# Patient Record
Sex: Male | Born: 1945
Health system: Southern US, Community
[De-identification: ages and names within clinical notes are randomized; demographics above are authoritative.]

## PROBLEM LIST (undated history)

## (undated) DIAGNOSIS — K219 Gastro-esophageal reflux disease without esophagitis: Secondary | ICD-10-CM

## (undated) DIAGNOSIS — L570 Actinic keratosis: Secondary | ICD-10-CM

## (undated) DIAGNOSIS — M1990 Unspecified osteoarthritis, unspecified site: Secondary | ICD-10-CM

## (undated) DIAGNOSIS — I2510 Atherosclerotic heart disease of native coronary artery without angina pectoris: Secondary | ICD-10-CM

## (undated) DIAGNOSIS — K5792 Diverticulitis of intestine, part unspecified, without perforation or abscess without bleeding: Secondary | ICD-10-CM

## (undated) DIAGNOSIS — E7800 Pure hypercholesterolemia, unspecified: Secondary | ICD-10-CM

## (undated) DIAGNOSIS — I10 Essential (primary) hypertension: Secondary | ICD-10-CM

## (undated) DIAGNOSIS — C801 Malignant (primary) neoplasm, unspecified: Secondary | ICD-10-CM

## (undated) HISTORY — PX: OTHER SURGICAL HISTORY: SHX169

## (undated) HISTORY — DX: Actinic keratosis: L57.0

---

## 1978-07-25 HISTORY — PX: OTHER SURGICAL HISTORY: SHX169

## 1999-12-21 ENCOUNTER — Encounter: Payer: Self-pay | Admitting: *Deleted

## 1999-12-21 ENCOUNTER — Ambulatory Visit (HOSPITAL_COMMUNITY): Admission: RE | Admit: 1999-12-21 | Discharge: 1999-12-21 | Payer: Self-pay | Admitting: *Deleted

## 2000-06-13 ENCOUNTER — Ambulatory Visit (HOSPITAL_COMMUNITY): Admission: RE | Admit: 2000-06-13 | Discharge: 2000-06-13 | Payer: Self-pay | Admitting: *Deleted

## 2004-03-12 ENCOUNTER — Emergency Department (HOSPITAL_COMMUNITY): Admission: EM | Admit: 2004-03-12 | Discharge: 2004-03-12 | Payer: Self-pay | Admitting: Emergency Medicine

## 2004-03-13 ENCOUNTER — Observation Stay (HOSPITAL_COMMUNITY): Admission: EM | Admit: 2004-03-13 | Discharge: 2004-03-13 | Payer: Self-pay | Admitting: Emergency Medicine

## 2008-04-01 ENCOUNTER — Encounter: Admission: RE | Admit: 2008-04-01 | Discharge: 2008-04-01 | Payer: Self-pay | Admitting: Internal Medicine

## 2008-11-03 DIAGNOSIS — C4491 Basal cell carcinoma of skin, unspecified: Secondary | ICD-10-CM

## 2008-11-03 DIAGNOSIS — C4492 Squamous cell carcinoma of skin, unspecified: Secondary | ICD-10-CM

## 2008-11-03 HISTORY — DX: Basal cell carcinoma of skin, unspecified: C44.91

## 2008-11-03 HISTORY — DX: Squamous cell carcinoma of skin, unspecified: C44.92

## 2009-02-10 DIAGNOSIS — C4499 Other specified malignant neoplasm of skin, unspecified: Secondary | ICD-10-CM

## 2009-02-10 HISTORY — DX: Other specified malignant neoplasm of skin, unspecified: C44.99

## 2009-09-28 DIAGNOSIS — C44320 Squamous cell carcinoma of skin of unspecified parts of face: Secondary | ICD-10-CM

## 2009-09-28 HISTORY — DX: Squamous cell carcinoma of skin of unspecified parts of face: C44.320

## 2010-01-18 DIAGNOSIS — C44519 Basal cell carcinoma of skin of other part of trunk: Secondary | ICD-10-CM

## 2010-01-18 HISTORY — DX: Basal cell carcinoma of skin of other part of trunk: C44.519

## 2010-12-10 NOTE — Cardiovascular Report (Signed)
[place]. [facility]  Patient:    Paul Mann, Paul Mann                        MRN: [id number] Proc. Date: 06/13/00 Adm. Date:  [id number] Attending:  Meade Maw A CC:         Cardiac Catheterization Laboratory  Georgann Housekeeper, M.D.   Cardiac Catheterization  PROCEDURE:  Cardiac catheterization.  CARDIOLOGIST:  Meade Maw, M.D.  INDICATIONS:  Chest pain, fatigue, with a small recent redistribution on the anterior wall.   Paul Mann is a 65 year old gentleman who has recently initiated an exercise training program.  During his exercise he experienced left shoulder pain, which radiated to his left lower back, and appeared to be aggravated by exertion.  A stress Cardiolyte was subsequently performed, revealing a small area of redistribution in the anterior apical region.  The ejection fraction was 58%.  The findings were discussed with the patient.  The patient was noted to have good exercise tolerance.  He exercises for nine minutes and 32 seconds.  He was noted to have 1.5 mm upsloping ST depression with exercise.  He had no inducible chest pain with exercise.  The risks, benefits, and options were subsequently discussed with the patient, and the patient elected to to proceed with a left heart catheterization.  DESCRIPTION OF PROCEDURE:  After obtaining a written informed consent, the patient was brought to the cardiac catheterization laboratory in the postabsorptive state.  Preoperative sedation was achieved using IV Versed. The right groin was prepped and draped in the usual sterile fashion.  Local anesthesia was achieved using 1% Xylocaine.  A 6-French hemostasis sheath was placed into the right femoral artery using the modified Seldinger technique. Selective coronary angiography was performed using JL4 and JR4 Judkins catheters.  Multiple views were obtained.  All catheter exchanges were made over a guide wire.  The hemostasis sheath was flushed following  each injection.  Following the procedure, the films were reviewed with Dr. Darci Needle III.  There was no critical coronary artery disease.  The patient was transferred to the holding area, and the hemostasis sheath was removed.  Hemostasis was achieved using digital pressure.  FINDINGS: Aortic pressure:  112/61. LV pressure:  112/11.  VENTRICULOGRAM:  A single plane ventriculogram revealed normal wall motion, ejection fraction of approximately 65%.  CORONARY ANGIOGRAPHY: 1. Left main coronary artery:  The left main coronary artery bifurcated    into the left anterior descending coronary artery and the circumflex    vessel.  There was no significant disease in the left main coronary    artery. 2. Left anterior descending coronary artery:  The LAD had mild diffuse    disease throughout the artery, and gave rise to a small to moderate    sized D-I and D-II, and then a small D-III and D-IV before ending as an    apical recurrent branch.  There was a 40% midvessel lesion noted in    the LAD. 3. Circumflex coronary artery:  The circumflex vessel gave rise to a moderate    sized OM-I and an eccentric 40%-50% proximal lesion, and a more distal    40% lesion. 4. Right coronary artery:  The right coronary artery was dominant, and    had a midvessel 30%-40% lesion, and a small PDA.  FINAL IMPRESSION:  Noncritical disease involving the mid-left anterior descending coronary artery, proximal circumflex, midcircumflex, and mid-right coronary artery with preserved left ventricular  function.  RECOMMENDATIONS:  Medical management is recommended.  I do not feel that  the nature of this disease is accountable for his chest pain.  He may continue with his exercise program.  Secondary risk reduction should be considered. His LDL should be less than 100.  The patient should continue with aspirin. DD:  06/13/00 TD:  06/13/00 Job: [id number]

## 2011-04-26 ENCOUNTER — Other Ambulatory Visit: Payer: Self-pay | Admitting: Sports Medicine

## 2011-04-26 DIAGNOSIS — M545 Low back pain: Secondary | ICD-10-CM

## 2011-04-29 ENCOUNTER — Ambulatory Visit
Admission: RE | Admit: 2011-04-29 | Discharge: 2011-04-29 | Disposition: A | Discharge status: Home / Self Care | Payer: Medicare Other | Source: Ambulatory Visit | Attending: Sports Medicine | Admitting: Sports Medicine

## 2011-04-29 DIAGNOSIS — M545 Low back pain, unspecified: Secondary | ICD-10-CM

## 2011-05-01 ENCOUNTER — Other Ambulatory Visit: Payer: Self-pay

## 2011-08-01 DIAGNOSIS — Z85828 Personal history of other malignant neoplasm of skin: Secondary | ICD-10-CM | POA: Diagnosis not present

## 2011-08-01 DIAGNOSIS — C4441 Basal cell carcinoma of skin of scalp and neck: Secondary | ICD-10-CM | POA: Diagnosis not present

## 2011-08-01 DIAGNOSIS — B079 Viral wart, unspecified: Secondary | ICD-10-CM | POA: Diagnosis not present

## 2011-08-01 DIAGNOSIS — L570 Actinic keratosis: Secondary | ICD-10-CM | POA: Diagnosis not present

## 2011-08-29 DIAGNOSIS — N529 Male erectile dysfunction, unspecified: Secondary | ICD-10-CM | POA: Diagnosis not present

## 2011-08-29 DIAGNOSIS — N400 Enlarged prostate without lower urinary tract symptoms: Secondary | ICD-10-CM | POA: Diagnosis not present

## 2011-08-29 DIAGNOSIS — N401 Enlarged prostate with lower urinary tract symptoms: Secondary | ICD-10-CM | POA: Diagnosis not present

## 2011-10-25 DIAGNOSIS — I10 Essential (primary) hypertension: Secondary | ICD-10-CM | POA: Diagnosis not present

## 2011-10-25 DIAGNOSIS — J309 Allergic rhinitis, unspecified: Secondary | ICD-10-CM | POA: Diagnosis not present

## 2011-10-25 DIAGNOSIS — Z1331 Encounter for screening for depression: Secondary | ICD-10-CM | POA: Diagnosis not present

## 2011-10-25 DIAGNOSIS — E782 Mixed hyperlipidemia: Secondary | ICD-10-CM | POA: Diagnosis not present

## 2011-10-25 DIAGNOSIS — Z0000 Encounter for general adult medical examination without abnormal findings: Secondary | ICD-10-CM | POA: Diagnosis not present

## 2011-12-07 DIAGNOSIS — E782 Mixed hyperlipidemia: Secondary | ICD-10-CM | POA: Diagnosis not present

## 2012-01-09 DIAGNOSIS — N400 Enlarged prostate without lower urinary tract symptoms: Secondary | ICD-10-CM | POA: Diagnosis not present

## 2012-01-09 DIAGNOSIS — E291 Testicular hypofunction: Secondary | ICD-10-CM | POA: Diagnosis not present

## 2012-01-11 DIAGNOSIS — N401 Enlarged prostate with lower urinary tract symptoms: Secondary | ICD-10-CM | POA: Diagnosis not present

## 2012-01-11 DIAGNOSIS — E291 Testicular hypofunction: Secondary | ICD-10-CM | POA: Diagnosis not present

## 2012-01-11 DIAGNOSIS — N529 Male erectile dysfunction, unspecified: Secondary | ICD-10-CM | POA: Diagnosis not present

## 2012-01-11 DIAGNOSIS — N400 Enlarged prostate without lower urinary tract symptoms: Secondary | ICD-10-CM | POA: Diagnosis not present

## 2012-01-13 DIAGNOSIS — E291 Testicular hypofunction: Secondary | ICD-10-CM | POA: Diagnosis not present

## 2012-01-13 DIAGNOSIS — E782 Mixed hyperlipidemia: Secondary | ICD-10-CM | POA: Diagnosis not present

## 2012-01-20 DIAGNOSIS — E291 Testicular hypofunction: Secondary | ICD-10-CM | POA: Diagnosis not present

## 2012-01-27 DIAGNOSIS — E291 Testicular hypofunction: Secondary | ICD-10-CM | POA: Diagnosis not present

## 2012-02-02 DIAGNOSIS — D2330 Other benign neoplasm of skin of unspecified part of face: Secondary | ICD-10-CM | POA: Diagnosis not present

## 2012-02-02 DIAGNOSIS — C44319 Basal cell carcinoma of skin of other parts of face: Secondary | ICD-10-CM | POA: Diagnosis not present

## 2012-02-10 DIAGNOSIS — E291 Testicular hypofunction: Secondary | ICD-10-CM | POA: Diagnosis not present

## 2012-02-24 DIAGNOSIS — E291 Testicular hypofunction: Secondary | ICD-10-CM | POA: Diagnosis not present

## 2012-03-09 DIAGNOSIS — E291 Testicular hypofunction: Secondary | ICD-10-CM | POA: Diagnosis not present

## 2012-03-23 DIAGNOSIS — E291 Testicular hypofunction: Secondary | ICD-10-CM | POA: Diagnosis not present

## 2012-04-06 DIAGNOSIS — E291 Testicular hypofunction: Secondary | ICD-10-CM | POA: Diagnosis not present

## 2012-04-19 DIAGNOSIS — E291 Testicular hypofunction: Secondary | ICD-10-CM | POA: Diagnosis not present

## 2012-04-30 DIAGNOSIS — M519 Unspecified thoracic, thoracolumbar and lumbosacral intervertebral disc disorder: Secondary | ICD-10-CM | POA: Diagnosis not present

## 2012-04-30 DIAGNOSIS — J309 Allergic rhinitis, unspecified: Secondary | ICD-10-CM | POA: Diagnosis not present

## 2012-04-30 DIAGNOSIS — N182 Chronic kidney disease, stage 2 (mild): Secondary | ICD-10-CM | POA: Diagnosis not present

## 2012-04-30 DIAGNOSIS — K219 Gastro-esophageal reflux disease without esophagitis: Secondary | ICD-10-CM | POA: Diagnosis not present

## 2012-04-30 DIAGNOSIS — E782 Mixed hyperlipidemia: Secondary | ICD-10-CM | POA: Diagnosis not present

## 2012-04-30 DIAGNOSIS — I10 Essential (primary) hypertension: Secondary | ICD-10-CM | POA: Diagnosis not present

## 2012-04-30 DIAGNOSIS — Z23 Encounter for immunization: Secondary | ICD-10-CM | POA: Diagnosis not present

## 2012-05-04 DIAGNOSIS — E291 Testicular hypofunction: Secondary | ICD-10-CM | POA: Diagnosis not present

## 2012-05-08 DIAGNOSIS — L578 Other skin changes due to chronic exposure to nonionizing radiation: Secondary | ICD-10-CM | POA: Diagnosis not present

## 2012-05-08 DIAGNOSIS — L570 Actinic keratosis: Secondary | ICD-10-CM | POA: Diagnosis not present

## 2012-05-08 DIAGNOSIS — Z85828 Personal history of other malignant neoplasm of skin: Secondary | ICD-10-CM | POA: Diagnosis not present

## 2012-05-08 DIAGNOSIS — L820 Inflamed seborrheic keratosis: Secondary | ICD-10-CM | POA: Diagnosis not present

## 2012-05-08 DIAGNOSIS — L821 Other seborrheic keratosis: Secondary | ICD-10-CM | POA: Diagnosis not present

## 2012-05-18 DIAGNOSIS — E291 Testicular hypofunction: Secondary | ICD-10-CM | POA: Diagnosis not present

## 2012-06-04 DIAGNOSIS — E291 Testicular hypofunction: Secondary | ICD-10-CM | POA: Diagnosis not present

## 2012-06-05 DIAGNOSIS — L570 Actinic keratosis: Secondary | ICD-10-CM | POA: Diagnosis not present

## 2012-06-18 DIAGNOSIS — E291 Testicular hypofunction: Secondary | ICD-10-CM | POA: Diagnosis not present

## 2012-06-25 DIAGNOSIS — E291 Testicular hypofunction: Secondary | ICD-10-CM | POA: Diagnosis not present

## 2012-06-25 DIAGNOSIS — N400 Enlarged prostate without lower urinary tract symptoms: Secondary | ICD-10-CM | POA: Diagnosis not present

## 2012-06-25 DIAGNOSIS — N529 Male erectile dysfunction, unspecified: Secondary | ICD-10-CM | POA: Diagnosis not present

## 2012-07-03 DIAGNOSIS — E291 Testicular hypofunction: Secondary | ICD-10-CM | POA: Diagnosis not present

## 2012-07-10 DIAGNOSIS — E291 Testicular hypofunction: Secondary | ICD-10-CM | POA: Diagnosis not present

## 2012-07-17 DIAGNOSIS — E291 Testicular hypofunction: Secondary | ICD-10-CM | POA: Diagnosis not present

## 2012-07-23 DIAGNOSIS — R0989 Other specified symptoms and signs involving the circulatory and respiratory systems: Secondary | ICD-10-CM | POA: Diagnosis not present

## 2012-07-23 DIAGNOSIS — R252 Cramp and spasm: Secondary | ICD-10-CM | POA: Diagnosis not present

## 2012-08-15 DIAGNOSIS — E291 Testicular hypofunction: Secondary | ICD-10-CM | POA: Diagnosis not present

## 2012-08-29 DIAGNOSIS — E291 Testicular hypofunction: Secondary | ICD-10-CM | POA: Diagnosis not present

## 2012-10-09 DIAGNOSIS — E291 Testicular hypofunction: Secondary | ICD-10-CM | POA: Diagnosis not present

## 2012-10-18 DIAGNOSIS — F4323 Adjustment disorder with mixed anxiety and depressed mood: Secondary | ICD-10-CM | POA: Diagnosis not present

## 2012-10-23 DIAGNOSIS — E291 Testicular hypofunction: Secondary | ICD-10-CM | POA: Diagnosis not present

## 2012-10-29 DIAGNOSIS — Z1331 Encounter for screening for depression: Secondary | ICD-10-CM | POA: Diagnosis not present

## 2012-10-29 DIAGNOSIS — E291 Testicular hypofunction: Secondary | ICD-10-CM | POA: Diagnosis not present

## 2012-10-29 DIAGNOSIS — K219 Gastro-esophageal reflux disease without esophagitis: Secondary | ICD-10-CM | POA: Diagnosis not present

## 2012-10-29 DIAGNOSIS — I10 Essential (primary) hypertension: Secondary | ICD-10-CM | POA: Diagnosis not present

## 2012-10-29 DIAGNOSIS — Z0000 Encounter for general adult medical examination without abnormal findings: Secondary | ICD-10-CM | POA: Diagnosis not present

## 2012-10-29 DIAGNOSIS — E782 Mixed hyperlipidemia: Secondary | ICD-10-CM | POA: Diagnosis not present

## 2012-10-29 DIAGNOSIS — N182 Chronic kidney disease, stage 2 (mild): Secondary | ICD-10-CM | POA: Diagnosis not present

## 2012-11-06 DIAGNOSIS — E291 Testicular hypofunction: Secondary | ICD-10-CM | POA: Diagnosis not present

## 2012-11-12 DIAGNOSIS — F4323 Adjustment disorder with mixed anxiety and depressed mood: Secondary | ICD-10-CM | POA: Diagnosis not present

## 2012-11-20 DIAGNOSIS — E291 Testicular hypofunction: Secondary | ICD-10-CM | POA: Diagnosis not present

## 2012-12-04 DIAGNOSIS — E291 Testicular hypofunction: Secondary | ICD-10-CM | POA: Diagnosis not present

## 2012-12-19 DIAGNOSIS — E291 Testicular hypofunction: Secondary | ICD-10-CM | POA: Diagnosis not present

## 2012-12-25 DIAGNOSIS — N529 Male erectile dysfunction, unspecified: Secondary | ICD-10-CM | POA: Diagnosis not present

## 2012-12-25 DIAGNOSIS — N400 Enlarged prostate without lower urinary tract symptoms: Secondary | ICD-10-CM | POA: Diagnosis not present

## 2012-12-25 DIAGNOSIS — E291 Testicular hypofunction: Secondary | ICD-10-CM | POA: Diagnosis not present

## 2012-12-28 DIAGNOSIS — F4323 Adjustment disorder with mixed anxiety and depressed mood: Secondary | ICD-10-CM | POA: Diagnosis not present

## 2013-01-01 DIAGNOSIS — E291 Testicular hypofunction: Secondary | ICD-10-CM | POA: Diagnosis not present

## 2013-01-15 DIAGNOSIS — E291 Testicular hypofunction: Secondary | ICD-10-CM | POA: Diagnosis not present

## 2013-01-29 DIAGNOSIS — E291 Testicular hypofunction: Secondary | ICD-10-CM | POA: Diagnosis not present

## 2013-02-18 DIAGNOSIS — E291 Testicular hypofunction: Secondary | ICD-10-CM | POA: Diagnosis not present

## 2013-03-04 DIAGNOSIS — E291 Testicular hypofunction: Secondary | ICD-10-CM | POA: Diagnosis not present

## 2013-03-18 DIAGNOSIS — E291 Testicular hypofunction: Secondary | ICD-10-CM | POA: Diagnosis not present

## 2013-04-02 DIAGNOSIS — E291 Testicular hypofunction: Secondary | ICD-10-CM | POA: Diagnosis not present

## 2013-04-17 DIAGNOSIS — E291 Testicular hypofunction: Secondary | ICD-10-CM | POA: Diagnosis not present

## 2013-04-29 DIAGNOSIS — N182 Chronic kidney disease, stage 2 (mild): Secondary | ICD-10-CM | POA: Diagnosis not present

## 2013-04-29 DIAGNOSIS — I10 Essential (primary) hypertension: Secondary | ICD-10-CM | POA: Diagnosis not present

## 2013-04-29 DIAGNOSIS — J309 Allergic rhinitis, unspecified: Secondary | ICD-10-CM | POA: Diagnosis not present

## 2013-04-29 DIAGNOSIS — K219 Gastro-esophageal reflux disease without esophagitis: Secondary | ICD-10-CM | POA: Diagnosis not present

## 2013-04-29 DIAGNOSIS — E782 Mixed hyperlipidemia: Secondary | ICD-10-CM | POA: Diagnosis not present

## 2013-04-30 DIAGNOSIS — E291 Testicular hypofunction: Secondary | ICD-10-CM | POA: Diagnosis not present

## 2013-05-15 DIAGNOSIS — E291 Testicular hypofunction: Secondary | ICD-10-CM | POA: Diagnosis not present

## 2013-05-23 DIAGNOSIS — Z23 Encounter for immunization: Secondary | ICD-10-CM | POA: Diagnosis not present

## 2013-05-29 DIAGNOSIS — E291 Testicular hypofunction: Secondary | ICD-10-CM | POA: Diagnosis not present

## 2013-06-12 DIAGNOSIS — E291 Testicular hypofunction: Secondary | ICD-10-CM | POA: Diagnosis not present

## 2013-06-25 DIAGNOSIS — E291 Testicular hypofunction: Secondary | ICD-10-CM | POA: Diagnosis not present

## 2013-07-01 DIAGNOSIS — E291 Testicular hypofunction: Secondary | ICD-10-CM | POA: Diagnosis not present

## 2013-07-08 DIAGNOSIS — N529 Male erectile dysfunction, unspecified: Secondary | ICD-10-CM | POA: Diagnosis not present

## 2013-07-08 DIAGNOSIS — N400 Enlarged prostate without lower urinary tract symptoms: Secondary | ICD-10-CM | POA: Diagnosis not present

## 2013-07-08 DIAGNOSIS — E291 Testicular hypofunction: Secondary | ICD-10-CM | POA: Diagnosis not present

## 2013-07-09 DIAGNOSIS — L570 Actinic keratosis: Secondary | ICD-10-CM | POA: Diagnosis not present

## 2013-07-09 DIAGNOSIS — L821 Other seborrheic keratosis: Secondary | ICD-10-CM | POA: Diagnosis not present

## 2013-07-09 DIAGNOSIS — C4441 Basal cell carcinoma of skin of scalp and neck: Secondary | ICD-10-CM | POA: Diagnosis not present

## 2013-07-09 DIAGNOSIS — D239 Other benign neoplasm of skin, unspecified: Secondary | ICD-10-CM | POA: Diagnosis not present

## 2013-07-09 DIAGNOSIS — L820 Inflamed seborrheic keratosis: Secondary | ICD-10-CM | POA: Diagnosis not present

## 2013-07-09 DIAGNOSIS — Z85828 Personal history of other malignant neoplasm of skin: Secondary | ICD-10-CM | POA: Diagnosis not present

## 2013-07-09 DIAGNOSIS — L578 Other skin changes due to chronic exposure to nonionizing radiation: Secondary | ICD-10-CM | POA: Diagnosis not present

## 2013-07-09 DIAGNOSIS — D485 Neoplasm of uncertain behavior of skin: Secondary | ICD-10-CM | POA: Diagnosis not present

## 2013-07-23 DIAGNOSIS — J111 Influenza due to unidentified influenza virus with other respiratory manifestations: Secondary | ICD-10-CM | POA: Diagnosis not present

## 2013-09-09 DIAGNOSIS — C4441 Basal cell carcinoma of skin of scalp and neck: Secondary | ICD-10-CM | POA: Diagnosis not present

## 2013-10-24 ENCOUNTER — Ambulatory Visit (INDEPENDENT_AMBULATORY_CARE_PROVIDER_SITE_OTHER): Payer: Medicare Other | Admitting: Sports Medicine

## 2013-10-24 ENCOUNTER — Encounter: Payer: Self-pay | Admitting: Sports Medicine

## 2013-10-24 VITALS — BP 162/83 | Ht 71.0 in | Wt 184.0 lb

## 2013-10-24 DIAGNOSIS — M542 Cervicalgia: Secondary | ICD-10-CM

## 2013-10-24 MED ORDER — KETOROLAC TROMETHAMINE 60 MG/2ML IM SOLN
60.0000 mg | Freq: Once | INTRAMUSCULAR | Status: AC
  Administered 2013-10-24: 60 mg via INTRAMUSCULAR

## 2013-10-24 MED ORDER — METHYLPREDNISOLONE ACETATE 80 MG/ML IJ SUSP
80.0000 mg | Freq: Once | INTRAMUSCULAR | Status: AC
  Administered 2013-10-24: 80 mg via INTRAMUSCULAR

## 2013-10-25 NOTE — Progress Notes (Signed)
   Subjective:    Patient ID: Paul Mann, male    DOB: November 01, 1945, 68 y.o.   MRN: [id number]  HPI chief complaint: Neck pain  Very pleasant 68 year old male comes in today complaining of several days of right-sided neck pain. No trauma that he can recall but a sudden onset of pain that he localizes mainly to the right trapezius. He has noticed some spasm in this muscle as well. He tried a massage without any improvement in pain. He denies any similar problems in the past but has had low back pain in the past which was treated by me with IM injections of both Depo-Medrol and Toradol with good symptom relief. He denies any radiating pain into his arms. He denies any associated numbness or tingling. 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 No prior neck surgeries. He takes Mobic and Flexeril when needed.  Past medical history review Medications include the aforementioned Mobic and Flexeril. In addition he also takes an 81 mg aspirin and lovastatin He is allergic to codeine and penicillin He does not smoke, he denies alcohol consumption    Review of Systems as above     Objective:   Physical Exam Well-developed, well-nourished. No acute distress. Awake alert and oriented x3. Vital signs are reviewed.  Cervical spine: Cervical rotation to the left is 80. Cervical rotation to the right is 50. Full extension and flexion. No tenderness to palpation along cervical midline but there is diffuse tenderness along the right paraspinal musculature and right trapezius. There is also a palpable trigger point in the right trapezius.  He has full painless shoulder range of motion. His strength is 5/5 both upper extremities with reflexes trace but equal at the biceps, triceps, and brachial radialis tendons. Good radial and ulnar pulses bilaterally.       Assessment & Plan:   Neck pain and trigger point secondary  to cervical strain versus cervical spine spondylopathy  Since he has responded favorably to IM injections of Depo-Medrol and Toradol in the past we will repeat those today. He is injected with 80 mg of Depo-Medrol IM and 60 mg of Toradol IM. His job is a very physical one and I think it would benefit him to stay out of work for one week. We will set up a tentative followup visit with me in 2 weeks which the patient may feel free to cancel if he is asymptomatic.

## 2013-10-30 DIAGNOSIS — K219 Gastro-esophageal reflux disease without esophagitis: Secondary | ICD-10-CM | POA: Diagnosis not present

## 2013-10-30 DIAGNOSIS — Z1331 Encounter for screening for depression: Secondary | ICD-10-CM | POA: Diagnosis not present

## 2013-10-30 DIAGNOSIS — Z0000 Encounter for general adult medical examination without abnormal findings: Secondary | ICD-10-CM | POA: Diagnosis not present

## 2013-10-30 DIAGNOSIS — E782 Mixed hyperlipidemia: Secondary | ICD-10-CM | POA: Diagnosis not present

## 2013-10-30 DIAGNOSIS — Z23 Encounter for immunization: Secondary | ICD-10-CM | POA: Diagnosis not present

## 2013-10-30 DIAGNOSIS — M542 Cervicalgia: Secondary | ICD-10-CM | POA: Diagnosis not present

## 2013-10-30 DIAGNOSIS — N182 Chronic kidney disease, stage 2 (mild): Secondary | ICD-10-CM | POA: Diagnosis not present

## 2013-10-30 DIAGNOSIS — I10 Essential (primary) hypertension: Secondary | ICD-10-CM | POA: Diagnosis not present

## 2013-10-30 DIAGNOSIS — J309 Allergic rhinitis, unspecified: Secondary | ICD-10-CM | POA: Diagnosis not present

## 2013-11-01 ENCOUNTER — Ambulatory Visit (INDEPENDENT_AMBULATORY_CARE_PROVIDER_SITE_OTHER): Payer: BC Managed Care – PPO | Admitting: Sports Medicine

## 2013-11-01 ENCOUNTER — Ambulatory Visit
Admission: RE | Admit: 2013-11-01 | Discharge: 2013-11-01 | Disposition: A | Discharge status: Home / Self Care | Payer: Medicare Other | Source: Ambulatory Visit | Attending: Sports Medicine | Admitting: Sports Medicine

## 2013-11-01 VITALS — BP 154/85 | Ht 71.0 in | Wt 182.0 lb

## 2013-11-01 DIAGNOSIS — M5030 Other cervical disc degeneration, unspecified cervical region: Secondary | ICD-10-CM

## 2013-11-01 DIAGNOSIS — M542 Cervicalgia: Secondary | ICD-10-CM

## 2013-11-01 MED ORDER — PREDNISONE (PAK) 10 MG PO TABS: ORAL_TABLET | ORAL | Status: DC

## 2013-11-01 NOTE — Progress Notes (Signed)
   Subjective:    Patient ID: Paul Mann, male    DOB: 1945-11-16, 68 y.o.   MRN: [id number]  HPI Patient comes in today with persistent right-sided neck and shoulder pain. The IM injections of Depo-Medrol and Toradol last week provided only one day of symptom relief. He continues to complain of pain in the right trapezius and right periscapular area. Some radiating discomfort into the shoulder as well. He describes it as a Surveyor, mining shock" type of feeling. He denies radiating pain past the shoulder. Denies any deep-seated shoulder pain. Denies any weakness in the right arm. His primary care physician gave him prescriptions for both tramadol as well as methocarbamol. He has tried the tramadol with some symptom relief but has yet to try the muscle relaxer. Difficulty sleeping at night as well. He is here today with his wife.  Medications reviewed    Review of Systems     Objective:   Physical Exam Well-developed, well-nourished. No acute distress. Awake alert and oriented x3. Vital signs reviewed.  Cervical spine: Patient still demonstrates reduced cervical rotation to the right by about 50%. There is no tenderness to palpation along cervical midline or paraspinal musculature. There is spasm and tenderness along the right trapezius and parascapular area diffusely but no trigger point.  Right shoulder: Full painless range of motion. Negative empty can. Rotator cuff strength is 5/5 and not reproducible pain.  Neurological exam: Strength is 5/5 both upper extremities with reflexes 1/4 at the biceps, triceps, and brachial radialis tendons bilaterally. Sensation is intact to light-touch grossly.  X-rays of the cervical spine including AP and lateral views shows moderate disc space narrowing and osteophyte formation at C5-C6 and C6-C7. Findings are consistent with cervical degenerative disc disease. Nothing acute is seen.       Assessment & Plan:  Cervical degenerative disc disease  6 day  Sterapred Dosepak to take as directed. He can continue with Ultram and methocarbamol when necessary. He'll start physical therapy with Paul Mann. Followup with me in 10 days. Remain out of work until his followup visit. We had a long discussion regarding his job situation. 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 He is asking whether or not I think he should think about changing positions. 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 We will discuss this further at followup. If his symptoms persist or worsen despite today's treatment, we will consider an MRI scan of the cervical spine in anticipation of possibly ordering cervical ESI's. He has had good success with these in the past for his lumbar spine (neurosurgeon- Dr Paul Mann).

## 2013-11-11 ENCOUNTER — Ambulatory Visit (INDEPENDENT_AMBULATORY_CARE_PROVIDER_SITE_OTHER): Payer: Medicare Other | Admitting: Sports Medicine

## 2013-11-11 ENCOUNTER — Encounter: Payer: Self-pay | Admitting: Sports Medicine

## 2013-11-11 ENCOUNTER — Ambulatory Visit: Payer: Medicare Other | Admitting: Sports Medicine

## 2013-11-11 VITALS — BP 173/95 | Ht 71.0 in | Wt 182.0 lb

## 2013-11-11 DIAGNOSIS — M25519 Pain in unspecified shoulder: Secondary | ICD-10-CM

## 2013-11-11 DIAGNOSIS — M542 Cervicalgia: Secondary | ICD-10-CM | POA: Diagnosis not present

## 2013-11-11 DIAGNOSIS — M25511 Pain in right shoulder: Secondary | ICD-10-CM

## 2013-11-11 DIAGNOSIS — M5030 Other cervical disc degeneration, unspecified cervical region: Secondary | ICD-10-CM | POA: Diagnosis not present

## 2013-11-11 NOTE — Patient Instructions (Signed)
You have been scheduled for a MRI of you neck on 11/13/13 at [facility].    Please arrive at 8:30 am for a  9 am appointment  Their address is [place]  Phone number is [phone]

## 2013-11-12 NOTE — Progress Notes (Signed)
   Subjective:    Patient ID: Paul Mann, male    DOB: 01-10-1946, 68 y.o.   MRN: [id number]  HPI Patient comes in today for followup. Unfortunately he continues to struggle with right-sided neck and posterior shoulder pain. Prednisone was minimally beneficial. Ultram was not helpful at all. He is now using meloxicam and methocarbamol. Discomfort is constantly present. No radiating pain down the arm. He has yet to start physical therapy. First appointment is this Wednesday. We had previously discussed the possibility of a cervical MRI if his symptoms persisted.   Review of Systems     Objective:   Physical Exam Well-developed, well-nourished. No acute distress  Limited cervical rotation to the right by about 50%. Diffuse tenderness to palpation along the paraspinal musculature and right trapezius. No tenderness to palpation along the midline. No focal neurological deficit of either upper extremity.  X-rays of his cervical spine showed degenerative disc disease at C5-C6 and C6-C7, worse at C5-C6. Nothing acute.       Assessment & Plan:  Cervical degenerative disc disease  MRI of the cervical spine with anticipation of ordering cervical ESI's. He has had good success with lumbar ESI's in the past. He will work with physical therapy as scheduled and I will call him with the MRI results once available. I think he is okay to return to work (light duty). I have given him a note restricting his lifting to no greater than 10 pounds. Continue with meloxicam and methocarbamol.

## 2013-11-13 ENCOUNTER — Ambulatory Visit
Admission: RE | Admit: 2013-11-13 | Discharge: 2013-11-13 | Disposition: A | Discharge status: Home / Self Care | Payer: Medicare Other | Source: Ambulatory Visit | Attending: Sports Medicine | Admitting: Sports Medicine

## 2013-11-13 DIAGNOSIS — M25511 Pain in right shoulder: Secondary | ICD-10-CM

## 2013-11-13 DIAGNOSIS — M5030 Other cervical disc degeneration, unspecified cervical region: Secondary | ICD-10-CM | POA: Diagnosis not present

## 2013-11-13 DIAGNOSIS — M542 Cervicalgia: Secondary | ICD-10-CM

## 2013-11-13 DIAGNOSIS — M4802 Spinal stenosis, cervical region: Secondary | ICD-10-CM | POA: Diagnosis not present

## 2013-11-15 ENCOUNTER — Telehealth: Payer: Self-pay | Admitting: Sports Medicine

## 2013-11-15 NOTE — Telephone Encounter (Signed)
I spoke with Paul Mann on the phone today after reviewing the MRI of his cervical spine. MRI shows multilevel degenerative disc disease. There is mild to moderate right foraminal stenosis at C3-C4 and mild bilateral foraminal stenosis at C5-C6 and C6-C7. Since his office visit with me earlier this week he has worked with Barbaraann Barthel and as a result is feeling much better. He was able to play golf yesterday without any trouble. I recommended that he continue to work with physical therapy and see how things progress over the next week or 2. We had previously discussed the possibility of cervical ESI's if his symptoms persisted but given his overall improvement I would like to hold on those for now. I also think he should remain on light duty at work.

## 2013-11-18 DIAGNOSIS — M5030 Other cervical disc degeneration, unspecified cervical region: Secondary | ICD-10-CM | POA: Diagnosis not present

## 2013-11-25 DIAGNOSIS — M5030 Other cervical disc degeneration, unspecified cervical region: Secondary | ICD-10-CM | POA: Diagnosis not present

## 2013-11-28 DIAGNOSIS — M5030 Other cervical disc degeneration, unspecified cervical region: Secondary | ICD-10-CM | POA: Diagnosis not present

## 2014-05-02 DIAGNOSIS — E780 Pure hypercholesterolemia: Secondary | ICD-10-CM | POA: Diagnosis not present

## 2014-05-02 DIAGNOSIS — N182 Chronic kidney disease, stage 2 (mild): Secondary | ICD-10-CM | POA: Diagnosis not present

## 2014-05-02 DIAGNOSIS — I10 Essential (primary) hypertension: Secondary | ICD-10-CM | POA: Diagnosis not present

## 2014-05-02 DIAGNOSIS — Z23 Encounter for immunization: Secondary | ICD-10-CM | POA: Diagnosis not present

## 2014-05-02 DIAGNOSIS — J309 Allergic rhinitis, unspecified: Secondary | ICD-10-CM | POA: Diagnosis not present

## 2014-05-02 DIAGNOSIS — K219 Gastro-esophageal reflux disease without esophagitis: Secondary | ICD-10-CM | POA: Diagnosis not present

## 2014-07-02 DIAGNOSIS — Z1283 Encounter for screening for malignant neoplasm of skin: Secondary | ICD-10-CM | POA: Diagnosis not present

## 2014-07-02 DIAGNOSIS — D229 Melanocytic nevi, unspecified: Secondary | ICD-10-CM | POA: Diagnosis not present

## 2014-07-02 DIAGNOSIS — D485 Neoplasm of uncertain behavior of skin: Secondary | ICD-10-CM | POA: Diagnosis not present

## 2014-07-02 DIAGNOSIS — L570 Actinic keratosis: Secondary | ICD-10-CM | POA: Diagnosis not present

## 2014-07-02 DIAGNOSIS — C44310 Basal cell carcinoma of skin of unspecified parts of face: Secondary | ICD-10-CM

## 2014-07-02 DIAGNOSIS — C44319 Basal cell carcinoma of skin of other parts of face: Secondary | ICD-10-CM | POA: Diagnosis not present

## 2014-07-02 DIAGNOSIS — I788 Other diseases of capillaries: Secondary | ICD-10-CM | POA: Diagnosis not present

## 2014-07-02 DIAGNOSIS — L578 Other skin changes due to chronic exposure to nonionizing radiation: Secondary | ICD-10-CM | POA: Diagnosis not present

## 2014-07-02 DIAGNOSIS — Z85828 Personal history of other malignant neoplasm of skin: Secondary | ICD-10-CM | POA: Diagnosis not present

## 2014-07-02 HISTORY — DX: Basal cell carcinoma of skin of unspecified parts of face: C44.310

## 2014-09-22 DIAGNOSIS — N5201 Erectile dysfunction due to arterial insufficiency: Secondary | ICD-10-CM | POA: Diagnosis not present

## 2014-09-22 DIAGNOSIS — R350 Frequency of micturition: Secondary | ICD-10-CM | POA: Diagnosis not present

## 2014-09-22 DIAGNOSIS — E291 Testicular hypofunction: Secondary | ICD-10-CM | POA: Diagnosis not present

## 2014-09-22 DIAGNOSIS — N401 Enlarged prostate with lower urinary tract symptoms: Secondary | ICD-10-CM | POA: Diagnosis not present

## 2014-10-10 DIAGNOSIS — E291 Testicular hypofunction: Secondary | ICD-10-CM | POA: Diagnosis not present

## 2014-10-24 DIAGNOSIS — E291 Testicular hypofunction: Secondary | ICD-10-CM | POA: Diagnosis not present

## 2014-11-04 DIAGNOSIS — N138 Other obstructive and reflux uropathy: Secondary | ICD-10-CM | POA: Diagnosis not present

## 2014-11-04 DIAGNOSIS — N5201 Erectile dysfunction due to arterial insufficiency: Secondary | ICD-10-CM | POA: Diagnosis not present

## 2014-11-04 DIAGNOSIS — N401 Enlarged prostate with lower urinary tract symptoms: Secondary | ICD-10-CM | POA: Diagnosis not present

## 2014-11-07 DIAGNOSIS — M519 Unspecified thoracic, thoracolumbar and lumbosacral intervertebral disc disorder: Secondary | ICD-10-CM | POA: Diagnosis not present

## 2014-11-07 DIAGNOSIS — Z0000 Encounter for general adult medical examination without abnormal findings: Secondary | ICD-10-CM | POA: Diagnosis not present

## 2014-11-07 DIAGNOSIS — J309 Allergic rhinitis, unspecified: Secondary | ICD-10-CM | POA: Diagnosis not present

## 2014-11-07 DIAGNOSIS — N182 Chronic kidney disease, stage 2 (mild): Secondary | ICD-10-CM | POA: Diagnosis not present

## 2014-11-07 DIAGNOSIS — K219 Gastro-esophageal reflux disease without esophagitis: Secondary | ICD-10-CM | POA: Diagnosis not present

## 2014-11-07 DIAGNOSIS — E782 Mixed hyperlipidemia: Secondary | ICD-10-CM | POA: Diagnosis not present

## 2014-11-07 DIAGNOSIS — E291 Testicular hypofunction: Secondary | ICD-10-CM | POA: Diagnosis not present

## 2014-11-07 DIAGNOSIS — I10 Essential (primary) hypertension: Secondary | ICD-10-CM | POA: Diagnosis not present

## 2014-11-07 DIAGNOSIS — Z1389 Encounter for screening for other disorder: Secondary | ICD-10-CM | POA: Diagnosis not present

## 2014-11-18 DIAGNOSIS — E291 Testicular hypofunction: Secondary | ICD-10-CM | POA: Diagnosis not present

## 2014-11-28 ENCOUNTER — Encounter: Payer: Self-pay | Admitting: Sports Medicine

## 2014-11-28 ENCOUNTER — Ambulatory Visit
Admission: RE | Admit: 2014-11-28 | Discharge: 2014-11-28 | Disposition: A | Discharge status: Home / Self Care | Payer: Medicare Other | Source: Ambulatory Visit | Attending: Sports Medicine | Admitting: Sports Medicine

## 2014-11-28 ENCOUNTER — Ambulatory Visit (INDEPENDENT_AMBULATORY_CARE_PROVIDER_SITE_OTHER): Payer: Medicare Other | Admitting: Sports Medicine

## 2014-11-28 VITALS — BP 158/76 | HR 70 | Ht 71.0 in | Wt 186.0 lb

## 2014-11-28 DIAGNOSIS — M5442 Lumbago with sciatica, left side: Secondary | ICD-10-CM | POA: Diagnosis not present

## 2014-11-28 DIAGNOSIS — M5137 Other intervertebral disc degeneration, lumbosacral region: Secondary | ICD-10-CM | POA: Diagnosis not present

## 2014-11-28 DIAGNOSIS — M47817 Spondylosis without myelopathy or radiculopathy, lumbosacral region: Secondary | ICD-10-CM | POA: Diagnosis not present

## 2014-11-28 MED ORDER — METHYLPREDNISOLONE ACETATE 80 MG/ML IJ SUSP
80.0000 mg | Freq: Once | INTRAMUSCULAR | Status: AC
Start: 2014-11-28 — End: 2014-11-28
  Administered 2014-11-28: 80 mg via INTRAMUSCULAR

## 2014-11-28 NOTE — Progress Notes (Signed)
   Subjective:    Patient ID: Paul Mann, male    DOB: 14-Apr-1946, 69 y.o.   MRN: [id number]  HPI  chief complaint: Low back pain  Patient comes in today with returning low back pain. He has a history of lumbar degenerative disc disease and has responded well to epidural steroid injections in the past. An MRI of his lumbar spine in 2012 showed a right foraminal protrusion at L4-L5 encroaching on the exiting right L4 nerve root. His pain is diffuse across his back. He gets spasm as well. He does take meloxicam and Flexeril occasionally in both the seem to help. He has recently begun to develop radiating pain into the left leg as well. Some mild numbness and tingling as well. No weakness. No change in bowel or bladder. Symptoms are not made worse with activity or with rest. No recent trauma.   Review of Systems     Objective:   Physical Exam Well-developed, well-nourished. No acute distress. Awake alert and oriented 3. Vital signs reviewed.  Low back: Good lumbar range of motion. There is diffuse spasm of the paraspinal musculature bilaterally. No tenderness to palpation along the lumbar midline. Positive straight leg raise on the left, equivocal on the right. Negative log roll bilaterally. Slight weakness with resisted great toe extension on the left compared to the right. Otherwise, strength is 5/5 both lower extremities. Sensation grossly intact to light-touch.       Assessment & Plan:  Returning low back pain and left leg radiculopathy secondary to degenerative disc disease  We will try a simple 80 mg Depo-Medrol IM injection. I've also given him home exercises. These exercises included both flexion and extension exercises and he will avoid those exercises that cause him pain. He may continue with his meloxicam and Flexeril as needed. I will get updated x-rays of his lumbar spine including AP and lateral views. I will call him early next week to see how he is doing. If symptoms persist I  would consider updating his lumbar spine MRI in anticipation of referring him for repeat lumbar epidural steroid injections. Patient is asking about the possibility of surgery and I've reassured him that I think we can manage this conservatively.

## 2014-12-02 ENCOUNTER — Telehealth: Payer: Self-pay | Admitting: Sports Medicine

## 2014-12-02 DIAGNOSIS — E291 Testicular hypofunction: Secondary | ICD-10-CM | POA: Diagnosis not present

## 2014-12-02 NOTE — Telephone Encounter (Signed)
Patient left me a message today on my voicemail after I attempted to call him earlier today. He states that his low back pain is much better after a recent IM Depo-Medrol cortisone injection. He has been able to golf without much problem. He is doing his home exercises. We had previously discussed doing an MRI of his lumbar spine but given his improvement with his recent injection I think we can hold on that for now. Patient will follow-up with me as needed.

## 2014-12-16 ENCOUNTER — Telehealth: Payer: Self-pay | Admitting: Sports Medicine

## 2014-12-16 ENCOUNTER — Encounter: Payer: Self-pay | Admitting: *Deleted

## 2014-12-16 DIAGNOSIS — M5442 Lumbago with sciatica, left side: Secondary | ICD-10-CM

## 2014-12-16 NOTE — Telephone Encounter (Signed)
Patient's wife contacted the office earlier today to tell us that her husband is still having severe low back pain. He is now beginning to get radiating pain into both legs as well. I had previously discussed with the patient proceeding with an MRI of his lumbar spine to rule out spinal stenosis if he did not get a prolonged beneficial response to the IM injection that we administered a couple of weeks ago. Therefore, we will go ahead with the MRI and I will call him after I review that study at which point we will delineate further treatment.

## 2014-12-17 ENCOUNTER — Other Ambulatory Visit: Payer: Self-pay | Admitting: *Deleted

## 2014-12-17 MED ORDER — TRAMADOL HCL 50 MG PO TABS: ORAL_TABLET | ORAL | Status: DC

## 2014-12-18 ENCOUNTER — Ambulatory Visit
Admission: RE | Admit: 2014-12-18 | Discharge: 2014-12-18 | Disposition: A | Discharge status: Home / Self Care | Payer: Medicare Other | Source: Ambulatory Visit | Attending: Sports Medicine | Admitting: Sports Medicine

## 2014-12-18 DIAGNOSIS — M5127 Other intervertebral disc displacement, lumbosacral region: Secondary | ICD-10-CM | POA: Diagnosis not present

## 2014-12-18 DIAGNOSIS — M5442 Lumbago with sciatica, left side: Secondary | ICD-10-CM

## 2014-12-18 DIAGNOSIS — M4806 Spinal stenosis, lumbar region: Secondary | ICD-10-CM | POA: Diagnosis not present

## 2014-12-18 DIAGNOSIS — M5136 Other intervertebral disc degeneration, lumbar region: Secondary | ICD-10-CM | POA: Diagnosis not present

## 2014-12-19 ENCOUNTER — Telehealth: Payer: Self-pay | Admitting: Sports Medicine

## 2014-12-19 ENCOUNTER — Encounter: Payer: Self-pay | Admitting: *Deleted

## 2014-12-19 DIAGNOSIS — M5442 Lumbago with sciatica, left side: Secondary | ICD-10-CM

## 2014-12-19 NOTE — Telephone Encounter (Signed)
I spoke with the patient on the phone today after reviewing the MRI of his lumbar spine. He has a right-sided lateral disc and osteophyte at L4-L5 causing severe right foraminal encroachment and impingement of the right L4 nerve root. He also has a small left-sided disc protrusion at L5-S1 which does seem to displace the left S1 nerve root. Both of these findings have progressed when compared to the MRI from 2012. His pain is primarily down his left leg. I recommended that we try a lumbar epidural steroidal injection at [facility] imaging. I've asked him to call me after that injection so that I can arrange for physical therapy. I did explain to him that it may take 2-3 injections before getting good symptom relief. If symptoms were to persist after that and we could consider neurosurgical consultation. In the meantime I have recommended that he avoid any heavy pushing or pulling.

## 2014-12-25 ENCOUNTER — Other Ambulatory Visit: Payer: Self-pay | Admitting: Sports Medicine

## 2014-12-25 DIAGNOSIS — M5442 Lumbago with sciatica, left side: Secondary | ICD-10-CM

## 2014-12-26 ENCOUNTER — Ambulatory Visit
Admission: RE | Admit: 2014-12-26 | Discharge: 2014-12-26 | Disposition: A | Discharge status: Home / Self Care | Payer: Medicare Other | Source: Ambulatory Visit | Attending: Sports Medicine | Admitting: Sports Medicine

## 2014-12-26 DIAGNOSIS — M5127 Other intervertebral disc displacement, lumbosacral region: Secondary | ICD-10-CM | POA: Diagnosis not present

## 2014-12-26 DIAGNOSIS — M5442 Lumbago with sciatica, left side: Secondary | ICD-10-CM

## 2014-12-26 DIAGNOSIS — M5137 Other intervertebral disc degeneration, lumbosacral region: Secondary | ICD-10-CM | POA: Diagnosis not present

## 2014-12-26 DIAGNOSIS — M5416 Radiculopathy, lumbar region: Secondary | ICD-10-CM | POA: Diagnosis not present

## 2014-12-26 MED ORDER — IOHEXOL 180 MG/ML  SOLN
1.0000 mL | Freq: Once | INTRAMUSCULAR | Status: AC | PRN
  Administered 2014-12-26: 1 mL via EPIDURAL

## 2014-12-26 MED ORDER — METHYLPREDNISOLONE ACETATE 40 MG/ML INJ SUSP (RADIOLOG
120.0000 mg | Freq: Once | INTRAMUSCULAR | Status: AC
  Administered 2014-12-26: 120 mg via EPIDURAL

## 2014-12-26 NOTE — Discharge Instructions (Signed)

## 2015-01-02 ENCOUNTER — Encounter: Payer: Self-pay | Admitting: Sports Medicine

## 2015-01-02 ENCOUNTER — Telehealth: Payer: Self-pay | Admitting: Sports Medicine

## 2015-01-02 NOTE — Telephone Encounter (Signed)
-----   Message from Carolyne Littles sent at 01/02/2015  9:03 AM EDT ----- Regarding: phone message Contact: [phone] Pt wants you to call him regarding an update on his back.

## 2015-01-02 NOTE — Telephone Encounter (Signed)
I spoke with the patient on the phone today. He received his first lumbar epidural steroid injection one week ago and as a result his symptoms have improved. He is also doing his home exercises. I'm encouraged by his progress but I think that it would be best that he limit his lifting to no heavier than 5-10 pounds. He works at a [employer] and I will provide him with a note to give to his employer. I explained to him that we could consider a second injection if his symptoms do not continue to improve. I've asked him to call me again next week with an update.

## 2015-01-13 ENCOUNTER — Other Ambulatory Visit: Payer: Self-pay | Admitting: *Deleted

## 2015-01-13 DIAGNOSIS — M5442 Lumbago with sciatica, left side: Secondary | ICD-10-CM

## 2015-01-20 ENCOUNTER — Other Ambulatory Visit: Payer: Self-pay | Admitting: Sports Medicine

## 2015-01-20 DIAGNOSIS — M5442 Lumbago with sciatica, left side: Secondary | ICD-10-CM

## 2015-01-21 ENCOUNTER — Other Ambulatory Visit: Payer: Medicare Other

## 2015-01-23 ENCOUNTER — Ambulatory Visit
Admission: RE | Admit: 2015-01-23 | Discharge: 2015-01-23 | Disposition: A | Discharge status: Home / Self Care | Payer: Medicare Other | Source: Ambulatory Visit | Attending: Sports Medicine | Admitting: Sports Medicine

## 2015-01-23 DIAGNOSIS — M5442 Lumbago with sciatica, left side: Secondary | ICD-10-CM

## 2015-01-23 DIAGNOSIS — M545 Low back pain: Secondary | ICD-10-CM | POA: Diagnosis not present

## 2015-01-23 MED ORDER — METHYLPREDNISOLONE ACETATE 40 MG/ML INJ SUSP (RADIOLOG
120.0000 mg | Freq: Once | INTRAMUSCULAR | Status: AC
  Administered 2015-01-23: 120 mg via EPIDURAL

## 2015-01-23 MED ORDER — IOHEXOL 180 MG/ML  SOLN
1.0000 mL | Freq: Once | INTRAMUSCULAR | Status: AC | PRN
  Administered 2015-01-23: 1 mL via EPIDURAL

## 2015-02-14 IMAGING — CR DG CERVICAL SPINE 2 OR 3 VIEWS
3 series · 3 of 3 positions shown · non-contrast
Comparison: None.

CLINICAL DATA: Right-sided neck pain and occasional right shoulder
pain.

EXAM:
CERVICAL SPINE - 2-3 VIEW

[view not recorded (1 of 3)]
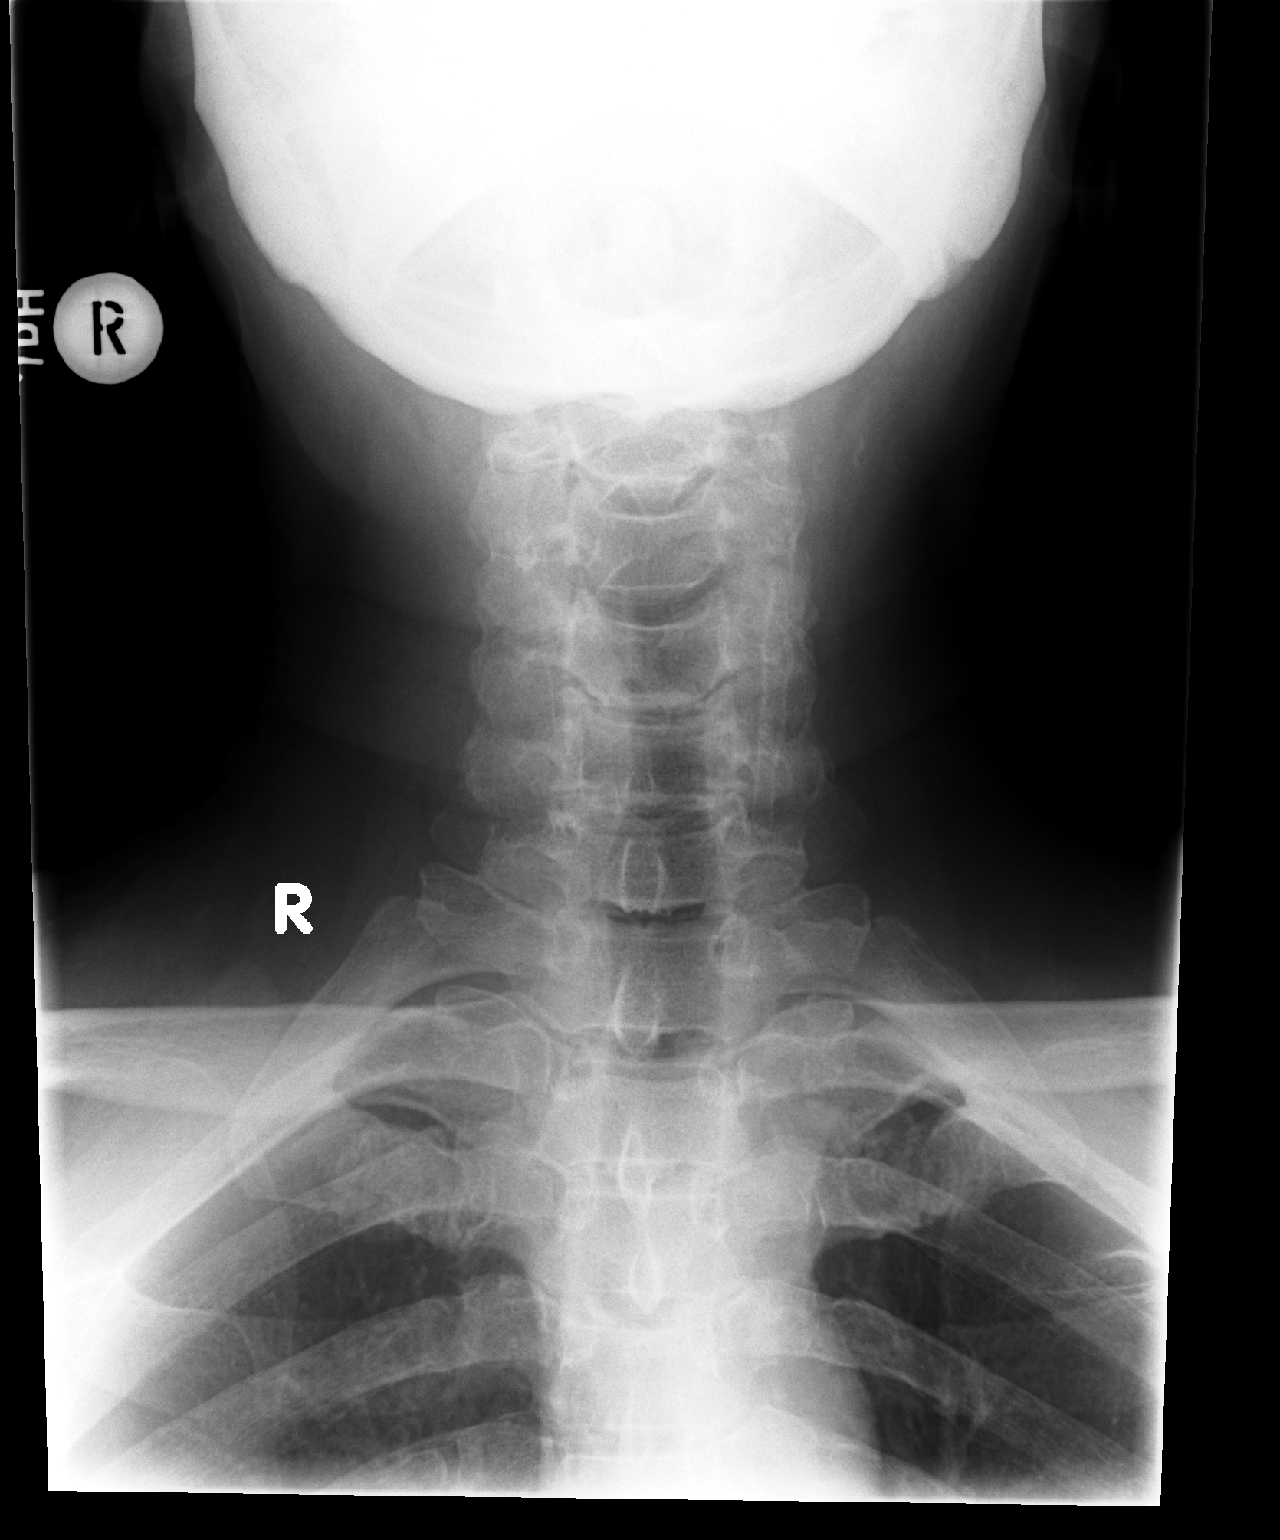

[view not recorded (2 of 3)]
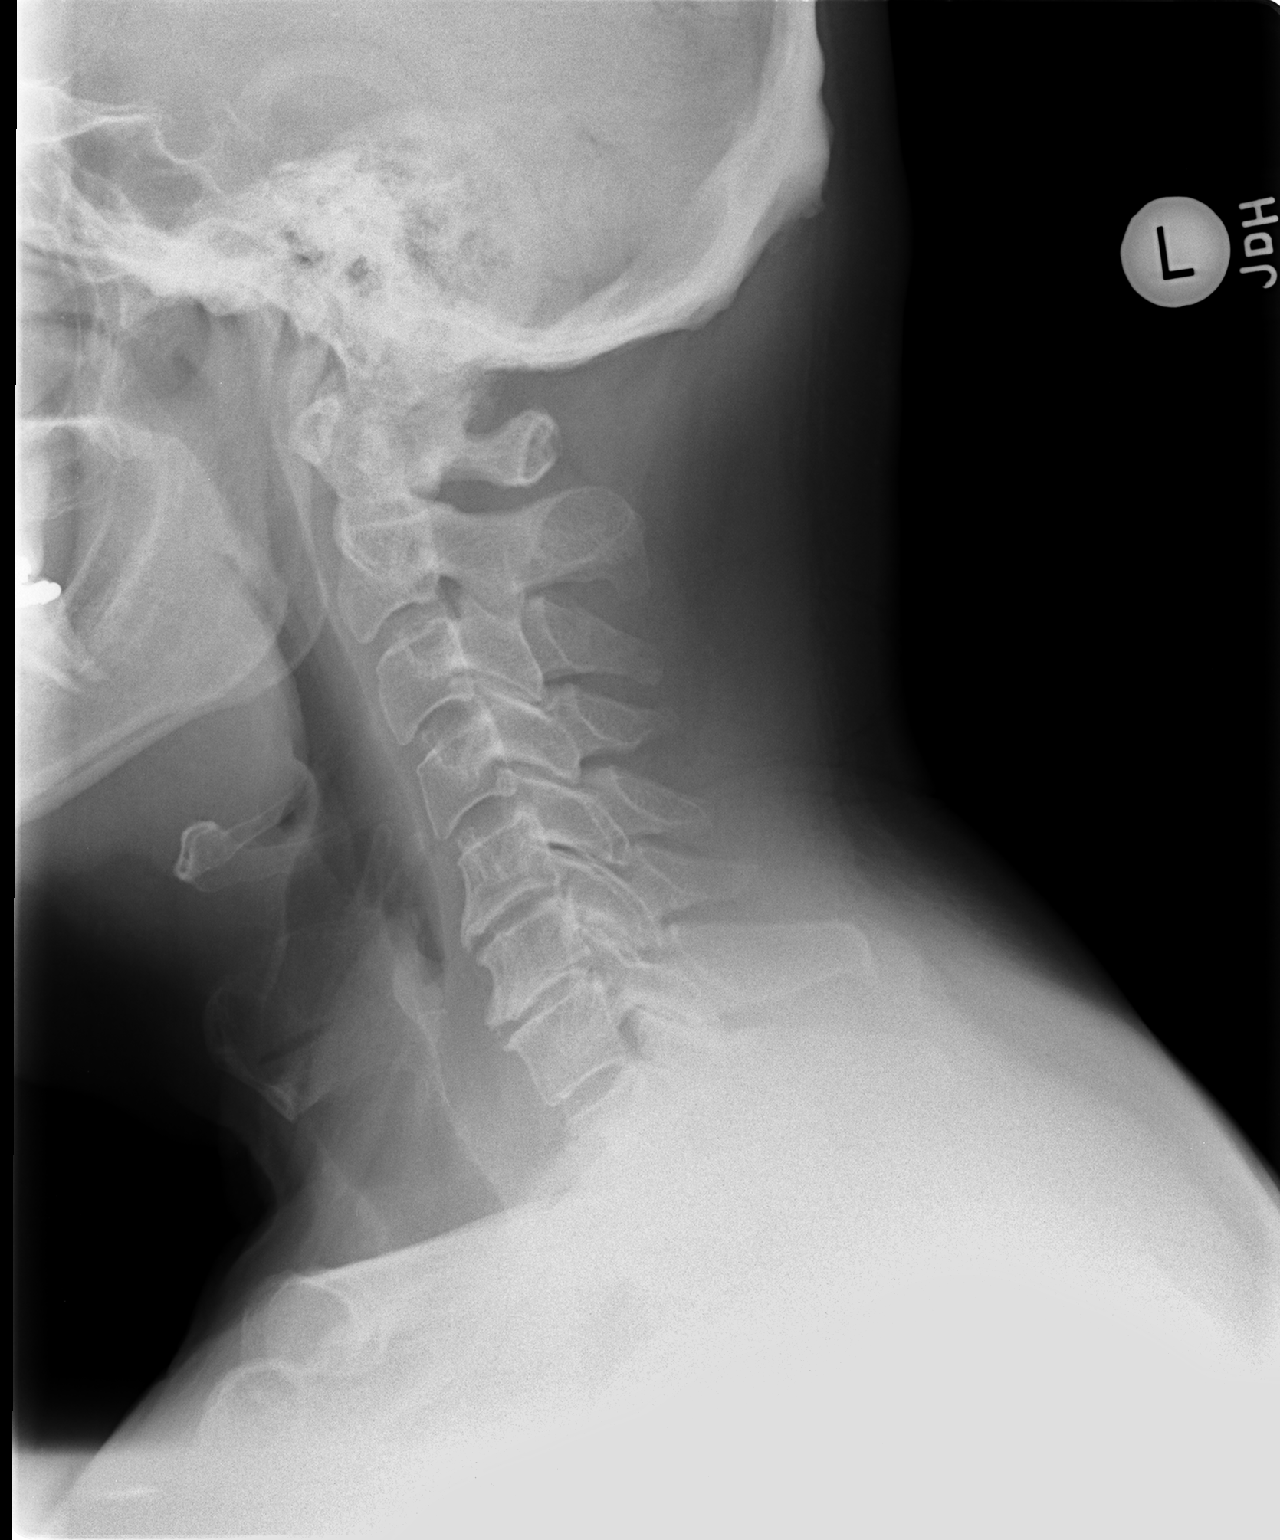

[view not recorded (3 of 3)]
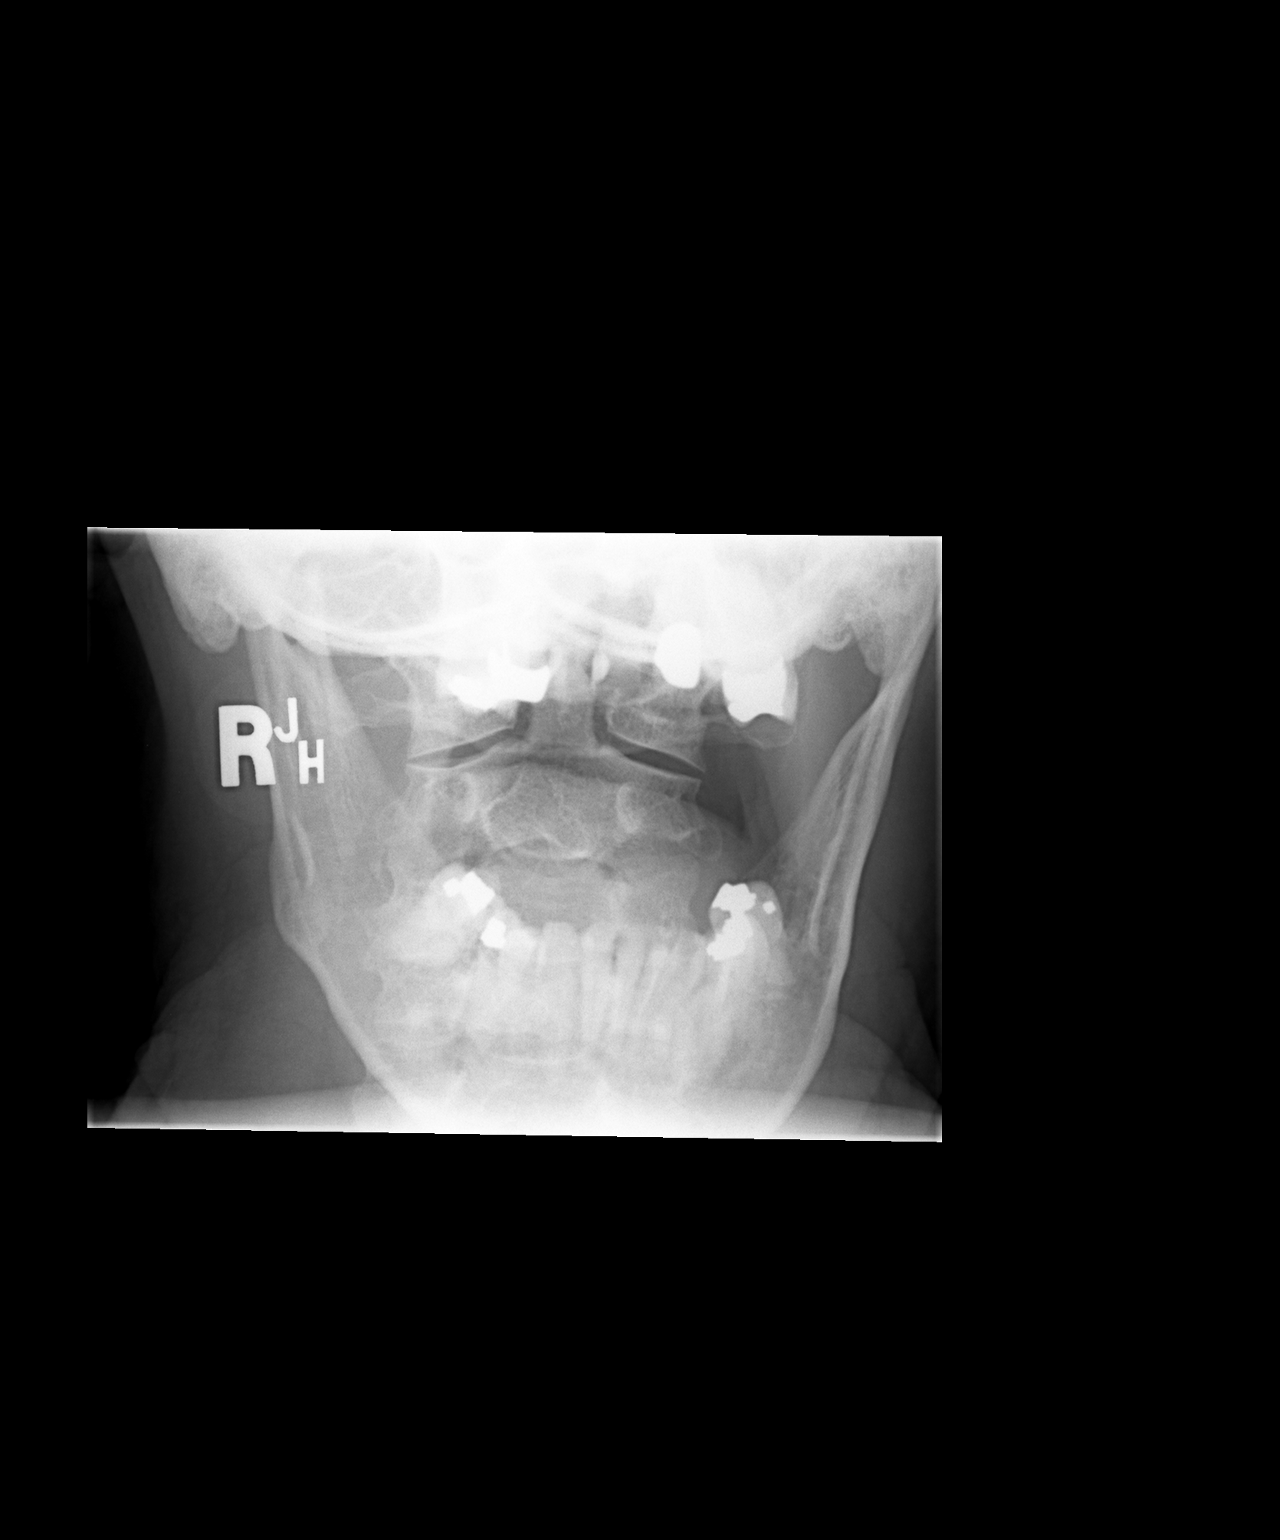

[3 of 3 positions shown; findings below may reference images not displayed]

FINDINGS: There is narrowing of the C5-6 and C6-7 disc spaces. There are small
uncinate osteophytes at both levels, most prominent at C5-6 on the
right.

There is no prevertebral soft tissue swelling or facet arthritis or
subluxation.
IMPRESSION: Moderate degenerative disc disease at C5-6 and C6-7.

## 2015-03-04 DIAGNOSIS — C44519 Basal cell carcinoma of skin of other part of trunk: Secondary | ICD-10-CM | POA: Diagnosis not present

## 2015-03-04 DIAGNOSIS — Z1283 Encounter for screening for malignant neoplasm of skin: Secondary | ICD-10-CM | POA: Diagnosis not present

## 2015-03-04 DIAGNOSIS — R21 Rash and other nonspecific skin eruption: Secondary | ICD-10-CM | POA: Diagnosis not present

## 2015-03-04 DIAGNOSIS — L570 Actinic keratosis: Secondary | ICD-10-CM | POA: Diagnosis not present

## 2015-03-04 DIAGNOSIS — D485 Neoplasm of uncertain behavior of skin: Secondary | ICD-10-CM | POA: Diagnosis not present

## 2015-03-04 DIAGNOSIS — L578 Other skin changes due to chronic exposure to nonionizing radiation: Secondary | ICD-10-CM | POA: Diagnosis not present

## 2015-03-04 DIAGNOSIS — Z85828 Personal history of other malignant neoplasm of skin: Secondary | ICD-10-CM | POA: Diagnosis not present

## 2015-04-14 DIAGNOSIS — C44519 Basal cell carcinoma of skin of other part of trunk: Secondary | ICD-10-CM | POA: Diagnosis not present

## 2015-05-12 DIAGNOSIS — E782 Mixed hyperlipidemia: Secondary | ICD-10-CM | POA: Diagnosis not present

## 2015-05-12 DIAGNOSIS — Z23 Encounter for immunization: Secondary | ICD-10-CM | POA: Diagnosis not present

## 2015-05-12 DIAGNOSIS — I10 Essential (primary) hypertension: Secondary | ICD-10-CM | POA: Diagnosis not present

## 2015-05-12 DIAGNOSIS — N182 Chronic kidney disease, stage 2 (mild): Secondary | ICD-10-CM | POA: Diagnosis not present

## 2015-05-12 DIAGNOSIS — K219 Gastro-esophageal reflux disease without esophagitis: Secondary | ICD-10-CM | POA: Diagnosis not present

## 2015-05-12 DIAGNOSIS — M519 Unspecified thoracic, thoracolumbar and lumbosacral intervertebral disc disorder: Secondary | ICD-10-CM | POA: Diagnosis not present

## 2015-05-22 DIAGNOSIS — E291 Testicular hypofunction: Secondary | ICD-10-CM | POA: Diagnosis not present

## 2015-05-22 DIAGNOSIS — N401 Enlarged prostate with lower urinary tract symptoms: Secondary | ICD-10-CM | POA: Diagnosis not present

## 2015-05-26 DIAGNOSIS — H2513 Age-related nuclear cataract, bilateral: Secondary | ICD-10-CM | POA: Diagnosis not present

## 2015-05-29 DIAGNOSIS — N5201 Erectile dysfunction due to arterial insufficiency: Secondary | ICD-10-CM | POA: Diagnosis not present

## 2015-05-29 DIAGNOSIS — N401 Enlarged prostate with lower urinary tract symptoms: Secondary | ICD-10-CM | POA: Diagnosis not present

## 2015-05-29 DIAGNOSIS — E291 Testicular hypofunction: Secondary | ICD-10-CM | POA: Diagnosis not present

## 2015-05-29 DIAGNOSIS — R3915 Urgency of urination: Secondary | ICD-10-CM | POA: Diagnosis not present

## 2015-06-08 ENCOUNTER — Encounter: Payer: Self-pay | Admitting: Sports Medicine

## 2015-06-08 ENCOUNTER — Ambulatory Visit (INDEPENDENT_AMBULATORY_CARE_PROVIDER_SITE_OTHER): Payer: Medicare Other | Admitting: Sports Medicine

## 2015-06-08 VITALS — BP 149/60 | HR 63 | Ht 71.0 in | Wt 180.0 lb

## 2015-06-08 DIAGNOSIS — M4806 Spinal stenosis, lumbar region: Secondary | ICD-10-CM | POA: Diagnosis not present

## 2015-06-08 DIAGNOSIS — M25562 Pain in left knee: Secondary | ICD-10-CM | POA: Diagnosis not present

## 2015-06-08 DIAGNOSIS — M48061 Spinal stenosis, lumbar region without neurogenic claudication: Secondary | ICD-10-CM

## 2015-06-08 MED ORDER — METHYLPREDNISOLONE ACETATE 40 MG/ML IJ SUSP
40.0000 mg | Freq: Once | INTRAMUSCULAR | Status: AC
  Administered 2015-06-08: 40 mg via INTRA_ARTICULAR

## 2015-06-08 NOTE — Progress Notes (Signed)
   Subjective:    Patient ID: Paul Mann, male    DOB: February 16, 1946, 69 y.o.   MRN: [id number]  HPI chief complaint: Left knee and bilateral leg pain  Patient comes in today with a couple of different complaints. He has a well-documented history of severe lumbar degenerative disc disease and spinal stenosis. He is beginning to have returning bilateral leg pain and weakness. But he is also complaining of some new onset left knee pain and stiffness which is present with activity. He does notice swelling in the left knee from time to time. He has had 2 rounds of lumbar epidural steroid injections. The first injections done in 2012 were very beneficial. Unfortunately, repeat injections done earlier this year were not as helpful. No change in bowel or bladder. He does notice some occasional weakness in his legs. He is here today with his wife.  Interim history reviewed Medications reviewed Allergies reviewed    Review of Systems    as above Objective:   Physical Exam Well-developed, well-nourished. No acute distress  Left knee: Range of motion is 0-100. Trace effusion. Slight tenderness palpation along the medial joint line but a negative McMurray's. Good ligamentous stability. Neurovascular intact distally. Walking with an antalgic gait.   MRI of his lumbar spine done in May of this year shows severe right foraminal stenosis at L4-L5. He also has a small left-sided disc protrusion at L5-S1 which displaces the left S1 nerve root.      Assessment & Plan:  Chronic low back pain and bilateral leg pain secondary to severe spinal stenosis Left knee pain likely secondary to DJD  For diagnostic as well as therapeutic reasons I have decided to inject his left knee with cortisone today. He tolerates this without difficulty. An anterior medial approach was utilized. I would like to set up an appointment with Dr. Ellene Route for his spinal stenosis. I would like for the patient to follow-up with me after  his appointment with Dr. Ellene Route. I will also provide him with a letter requesting an excusal for his upcoming jury duty.  Consent obtained and verified. Time-out conducted. Noted no overlying erythema, induration, or other signs of local infection. Skin prepped in a sterile fashion. Topical analgesic spray: Ethyl chloride. Joint: left knee Needle: 22g 1.5 inch Completed without difficulty. Meds: 3cc 1% xylocaine, 1cc (40mg ) depomedrol  Advised to call if fevers/chills, erythema, induration, drainage, or persistent bleeding.

## 2015-06-11 DIAGNOSIS — C44319 Basal cell carcinoma of skin of other parts of face: Secondary | ICD-10-CM | POA: Diagnosis not present

## 2015-07-02 DIAGNOSIS — R030 Elevated blood-pressure reading, without diagnosis of hypertension: Secondary | ICD-10-CM | POA: Diagnosis not present

## 2015-07-02 DIAGNOSIS — Z6826 Body mass index (BMI) 26.0-26.9, adult: Secondary | ICD-10-CM | POA: Diagnosis not present

## 2015-07-10 DIAGNOSIS — M5416 Radiculopathy, lumbar region: Secondary | ICD-10-CM | POA: Diagnosis not present

## 2015-09-28 ENCOUNTER — Ambulatory Visit (INDEPENDENT_AMBULATORY_CARE_PROVIDER_SITE_OTHER): Payer: Medicare Other | Admitting: Sports Medicine

## 2015-09-28 ENCOUNTER — Ambulatory Visit
Admission: RE | Admit: 2015-09-28 | Discharge: 2015-09-28 | Disposition: A | Discharge status: Home / Self Care | Payer: Medicare Other | Source: Ambulatory Visit | Attending: Sports Medicine | Admitting: Sports Medicine

## 2015-09-28 ENCOUNTER — Encounter: Payer: Self-pay | Admitting: Sports Medicine

## 2015-09-28 VITALS — BP 165/72 | HR 67 | Ht 71.0 in | Wt 185.0 lb

## 2015-09-28 DIAGNOSIS — M25562 Pain in left knee: Secondary | ICD-10-CM

## 2015-09-28 DIAGNOSIS — M179 Osteoarthritis of knee, unspecified: Secondary | ICD-10-CM | POA: Diagnosis not present

## 2015-09-28 MED ORDER — METHYLPREDNISOLONE ACETATE 40 MG/ML IJ SUSP
40.0000 mg | Freq: Once | INTRAMUSCULAR | Status: AC
  Administered 2015-09-28: 40 mg via INTRA_ARTICULAR

## 2015-09-28 NOTE — Progress Notes (Signed)
   Subjective:    Patient ID: Paul Mann, male    DOB: 1945-09-30, 70 y.o.   MRN: [id number]  HPI chief complaint: Left knee pain  Patient comes in today with persistent left knee pain. He received a cortisone injection in this knee in November of last year. Injection did help somewhat temporarily but his pain has returned. It is diffuse throughout the knee. He has noticed an inability to completely flex the knee. He will get occasional swelling as well. Also, some instability. No prior knee surgeries. No recent trauma. No locking of the knee.    Review of Systems    as above Objective:   Physical Exam  Well-developed, well-nourished. No acute distress.  Left knee: Range of motion is 0-100. Trace effusion. Trace patellofemoral crepitus. There is no tenderness to palpation along the medial or lateral joint lines. Negative McMurray's. Negative Thessaly's. Good joint stability. Neurovascularly intact distally. Walking without significant limp.  X-rays of the left knee including AP, lateral, and 30 flexion, and sunrise views are reviewed. There may be a loose body in the anterior joint space. This is best seen on the lateral view. Otherwise, no fracture or significant degenerative changes seen.      Assessment & Plan:   Returning left knee pain possibly secondary to loose body versus degenerative meniscal tear  Patient's left knee was reinjected today with cortisone. This was done after risks and benefits were explained. It was accomplished atraumatically under sterile technique. Patient tolerated this without difficulty. An anterior lateral approach utilized. I would like to get an MRI of his left knee to better evaluate the possible loose body seen on x-ray as well as to rule out a degenerative meniscal tear. I will call the patient with those results once available at which point we would the linea a further treatment.  Consent obtained and verified. Time-out conducted. Noted no  overlying erythema, induration, or other signs of local infection. Skin prepped in a sterile fashion. Topical analgesic spray: Ethyl chloride. Joint: left knee Needle: 22g 1.5 inch Completed without difficulty. Meds: 3cc 1% xylocaine, 1cc (40mg ) depomedrol  Advised to call if fevers/chills, erythema, induration, drainage, or persistent bleeding.

## 2015-10-06 ENCOUNTER — Ambulatory Visit
Admission: RE | Admit: 2015-10-06 | Discharge: 2015-10-06 | Disposition: A | Discharge status: Home / Self Care | Payer: Medicare Other | Source: Ambulatory Visit | Attending: Sports Medicine | Admitting: Sports Medicine

## 2015-10-06 ENCOUNTER — Telehealth: Payer: Self-pay | Admitting: Sports Medicine

## 2015-10-06 DIAGNOSIS — M25562 Pain in left knee: Secondary | ICD-10-CM

## 2015-10-06 DIAGNOSIS — M67462 Ganglion, left knee: Secondary | ICD-10-CM | POA: Diagnosis not present

## 2015-10-06 NOTE — Telephone Encounter (Signed)
I spoke with the patient on the phone today after reviewing the MRI of his left knee. MRI shows two large loose bodies in the anterior knee. This correlates with the x-ray findings as well. Although the official radiology read does not mention the loose bodies, I discussed this case with one of the other musculoskeletal radiologists and we are both in agreement that there are in fact loose bodies in his joint. Therefore, I think he would do well with a simple knee scope to have the loose bodies removed. He is certainly getting mechanical symptoms in addition to knee pain. He will contact me with the name of the orthopedist that he would like to be referred to and I will defer further treatment to their discretion.

## 2015-10-08 NOTE — Telephone Encounter (Signed)
Paul Mann and [facility] Dr [address] [place] [address] [place] [address] Friday 10/09/15 at [place]

## 2015-10-09 DIAGNOSIS — M25562 Pain in left knee: Secondary | ICD-10-CM | POA: Diagnosis not present

## 2015-11-02 DIAGNOSIS — C44319 Basal cell carcinoma of skin of other parts of face: Secondary | ICD-10-CM | POA: Diagnosis not present

## 2015-11-11 DIAGNOSIS — K219 Gastro-esophageal reflux disease without esophagitis: Secondary | ICD-10-CM | POA: Diagnosis not present

## 2015-11-11 DIAGNOSIS — M519 Unspecified thoracic, thoracolumbar and lumbosacral intervertebral disc disorder: Secondary | ICD-10-CM | POA: Diagnosis not present

## 2015-11-11 DIAGNOSIS — N182 Chronic kidney disease, stage 2 (mild): Secondary | ICD-10-CM | POA: Diagnosis not present

## 2015-11-11 DIAGNOSIS — I10 Essential (primary) hypertension: Secondary | ICD-10-CM | POA: Diagnosis not present

## 2015-11-11 DIAGNOSIS — Z0000 Encounter for general adult medical examination without abnormal findings: Secondary | ICD-10-CM | POA: Diagnosis not present

## 2015-11-11 DIAGNOSIS — Z1389 Encounter for screening for other disorder: Secondary | ICD-10-CM | POA: Diagnosis not present

## 2015-11-11 DIAGNOSIS — J309 Allergic rhinitis, unspecified: Secondary | ICD-10-CM | POA: Diagnosis not present

## 2015-11-11 DIAGNOSIS — E782 Mixed hyperlipidemia: Secondary | ICD-10-CM | POA: Diagnosis not present

## 2015-12-03 DIAGNOSIS — L812 Freckles: Secondary | ICD-10-CM | POA: Diagnosis not present

## 2015-12-03 DIAGNOSIS — Z85828 Personal history of other malignant neoplasm of skin: Secondary | ICD-10-CM | POA: Diagnosis not present

## 2015-12-03 DIAGNOSIS — L578 Other skin changes due to chronic exposure to nonionizing radiation: Secondary | ICD-10-CM | POA: Diagnosis not present

## 2015-12-03 DIAGNOSIS — Z1283 Encounter for screening for malignant neoplasm of skin: Secondary | ICD-10-CM | POA: Diagnosis not present

## 2015-12-03 DIAGNOSIS — L570 Actinic keratosis: Secondary | ICD-10-CM | POA: Diagnosis not present

## 2015-12-03 DIAGNOSIS — D1801 Hemangioma of skin and subcutaneous tissue: Secondary | ICD-10-CM | POA: Diagnosis not present

## 2015-12-03 DIAGNOSIS — D485 Neoplasm of uncertain behavior of skin: Secondary | ICD-10-CM | POA: Diagnosis not present

## 2015-12-03 DIAGNOSIS — L820 Inflamed seborrheic keratosis: Secondary | ICD-10-CM | POA: Diagnosis not present

## 2015-12-03 DIAGNOSIS — L821 Other seborrheic keratosis: Secondary | ICD-10-CM | POA: Diagnosis not present

## 2015-12-03 DIAGNOSIS — D229 Melanocytic nevi, unspecified: Secondary | ICD-10-CM | POA: Diagnosis not present

## 2015-12-23 ENCOUNTER — Other Ambulatory Visit: Payer: Self-pay | Admitting: Gastroenterology

## 2015-12-31 DIAGNOSIS — R5383 Other fatigue: Secondary | ICD-10-CM | POA: Diagnosis not present

## 2015-12-31 DIAGNOSIS — W57XXXA Bitten or stung by nonvenomous insect and other nonvenomous arthropods, initial encounter: Secondary | ICD-10-CM | POA: Diagnosis not present

## 2016-02-10 DIAGNOSIS — Z85828 Personal history of other malignant neoplasm of skin: Secondary | ICD-10-CM | POA: Diagnosis not present

## 2016-02-10 DIAGNOSIS — C44519 Basal cell carcinoma of skin of other part of trunk: Secondary | ICD-10-CM | POA: Diagnosis not present

## 2016-02-10 DIAGNOSIS — L570 Actinic keratosis: Secondary | ICD-10-CM | POA: Diagnosis not present

## 2016-02-10 DIAGNOSIS — L578 Other skin changes due to chronic exposure to nonionizing radiation: Secondary | ICD-10-CM | POA: Diagnosis not present

## 2016-02-10 DIAGNOSIS — D485 Neoplasm of uncertain behavior of skin: Secondary | ICD-10-CM | POA: Diagnosis not present

## 2016-02-10 HISTORY — PX: OTHER SURGICAL HISTORY: SHX169

## 2016-02-17 ENCOUNTER — Encounter (HOSPITAL_COMMUNITY): Payer: Self-pay | Admitting: *Deleted

## 2016-02-22 ENCOUNTER — Encounter (HOSPITAL_COMMUNITY)
Admission: RE | Disposition: A | Discharge status: Home / Self Care | Payer: Self-pay | Source: Ambulatory Visit | Attending: Gastroenterology

## 2016-02-22 ENCOUNTER — Ambulatory Visit (HOSPITAL_COMMUNITY): Payer: Medicare Other | Admitting: Anesthesiology

## 2016-02-22 ENCOUNTER — Ambulatory Visit (HOSPITAL_COMMUNITY)
Admission: RE | Admit: 2016-02-22 | Discharge: 2016-02-22 | Disposition: A | Discharge status: Home / Self Care | Payer: Medicare Other | Source: Ambulatory Visit | Attending: Gastroenterology | Admitting: Gastroenterology

## 2016-02-22 ENCOUNTER — Encounter (HOSPITAL_COMMUNITY): Payer: Self-pay

## 2016-02-22 DIAGNOSIS — Z791 Long term (current) use of non-steroidal anti-inflammatories (NSAID): Secondary | ICD-10-CM | POA: Insufficient documentation

## 2016-02-22 DIAGNOSIS — E7800 Pure hypercholesterolemia, unspecified: Secondary | ICD-10-CM | POA: Diagnosis not present

## 2016-02-22 DIAGNOSIS — K219 Gastro-esophageal reflux disease without esophagitis: Secondary | ICD-10-CM | POA: Diagnosis not present

## 2016-02-22 DIAGNOSIS — Z7982 Long term (current) use of aspirin: Secondary | ICD-10-CM | POA: Insufficient documentation

## 2016-02-22 DIAGNOSIS — Z1211 Encounter for screening for malignant neoplasm of colon: Secondary | ICD-10-CM | POA: Diagnosis not present

## 2016-02-22 DIAGNOSIS — K5730 Diverticulosis of large intestine without perforation or abscess without bleeding: Secondary | ICD-10-CM | POA: Diagnosis not present

## 2016-02-22 DIAGNOSIS — Z86010 Personal history of colonic polyps: Secondary | ICD-10-CM | POA: Diagnosis not present

## 2016-02-22 DIAGNOSIS — K5790 Diverticulosis of intestine, part unspecified, without perforation or abscess without bleeding: Secondary | ICD-10-CM | POA: Diagnosis not present

## 2016-02-22 DIAGNOSIS — J309 Allergic rhinitis, unspecified: Secondary | ICD-10-CM | POA: Diagnosis not present

## 2016-02-22 DIAGNOSIS — Z79899 Other long term (current) drug therapy: Secondary | ICD-10-CM | POA: Insufficient documentation

## 2016-02-22 DIAGNOSIS — I10 Essential (primary) hypertension: Secondary | ICD-10-CM | POA: Insufficient documentation

## 2016-02-22 DIAGNOSIS — Z7951 Long term (current) use of inhaled steroids: Secondary | ICD-10-CM | POA: Insufficient documentation

## 2016-02-22 HISTORY — DX: Malignant (primary) neoplasm, unspecified: C80.1

## 2016-02-22 HISTORY — DX: Essential (primary) hypertension: I10

## 2016-02-22 HISTORY — DX: Unspecified osteoarthritis, unspecified site: M19.90

## 2016-02-22 HISTORY — PX: COLONOSCOPY WITH PROPOFOL: SHX5780

## 2016-02-22 HISTORY — DX: Pure hypercholesterolemia, unspecified: E78.00

## 2016-02-22 HISTORY — DX: Diverticulitis of intestine, part unspecified, without perforation or abscess without bleeding: K57.92

## 2016-02-22 HISTORY — DX: Gastro-esophageal reflux disease without esophagitis: K21.9

## 2016-02-22 HISTORY — DX: Atherosclerotic heart disease of native coronary artery without angina pectoris: I25.10

## 2016-02-22 SURGERY — COLONOSCOPY WITH PROPOFOL
Anesthesia: Monitor Anesthesia Care

## 2016-02-22 MED ORDER — PROPOFOL 10 MG/ML IV BOLUS
INTRAVENOUS | Status: AC
  Filled 2016-02-22: qty 60

## 2016-02-22 MED ORDER — LIDOCAINE HCL (CARDIAC) 20 MG/ML IV SOLN
INTRAVENOUS | Status: DC | PRN
  Administered 2016-02-22: 25 mg via INTRATRACHEAL

## 2016-02-22 MED ORDER — SODIUM CHLORIDE 0.9 % IV SOLN: INTRAVENOUS | Status: DC

## 2016-02-22 MED ORDER — PROPOFOL 10 MG/ML IV BOLUS
INTRAVENOUS | Status: DC | PRN
  Administered 2016-02-22 (×2): 20 mg via INTRAVENOUS

## 2016-02-22 MED ORDER — LACTATED RINGERS IV SOLN
INTRAVENOUS | Status: DC
  Administered 2016-02-22: 08:00:00 via INTRAVENOUS

## 2016-02-22 MED ORDER — PROPOFOL 500 MG/50ML IV EMUL
INTRAVENOUS | Status: DC | PRN
  Administered 2016-02-22: 125 ug/kg/min via INTRAVENOUS

## 2016-02-22 MED ORDER — LIDOCAINE HCL (CARDIAC) 20 MG/ML IV SOLN
INTRAVENOUS | Status: AC
  Filled 2016-02-22: qty 5

## 2016-02-22 SURGICAL SUPPLY — 22 items

## 2016-02-22 NOTE — Transfer of Care (Signed)
Immediate Anesthesia Transfer of Care Note  Patient: Paul Mann  Procedure(s) Performed: Procedure(s): COLONOSCOPY WITH PROPOFOL (N/A)  Patient Location: PACU and Endoscopy Unit  Anesthesia Type:MAC  Level of Consciousness: patient cooperative  Airway & Oxygen Therapy: Patient Spontanous Breathing and Patient connected to face mask oxygen  Post-op Assessment: Report given to RN and Post -op Vital signs reviewed and stable  Post vital signs: Reviewed and stable  Last Vitals:  Vitals:   02/22/16 0823  BP: (!) 173/79  Resp: 10  Temp: 36.7 C    Last Pain:  Vitals:   02/22/16 0823  TempSrc: Oral         Complications: No apparent anesthesia complications

## 2016-02-22 NOTE — Discharge Instructions (Signed)

## 2016-02-22 NOTE — Anesthesia Preprocedure Evaluation (Signed)
Anesthesia Evaluation  Patient identified by MRN, date of birth, ID band Patient awake    Reviewed: Allergy & Precautions, H&P , Patient's Chart, lab work & pertinent test results, reviewed documented beta blocker date and time   Airway Mallampati: II  TM Distance: >3 FB Neck ROM: full    Dental no notable dental hx.    Pulmonary    Pulmonary exam normal breath sounds clear to auscultation       Cardiovascular hypertension, On Medications  Rhythm:regular Rate:Normal     Neuro/Psych    GI/Hepatic GERD  Medicated,  Endo/Other    Renal/GU      Musculoskeletal   Abdominal   Peds  Hematology   Anesthesia Other Findings   Reproductive/Obstetrics                             Anesthesia Physical Anesthesia Plan  ASA: II  Anesthesia Plan: MAC   Post-op Pain Management:    Induction: Intravenous  Airway Management Planned: Mask and Natural Airway  Additional Equipment:   Intra-op Plan:   Post-operative Plan:   Informed Consent: I have reviewed the patients History and Physical, chart, labs and discussed the procedure including the risks, benefits and alternatives for the proposed anesthesia with the patient or authorized representative who has indicated his/her understanding and acceptance.   Dental Advisory Given  Plan Discussed with: CRNA and Surgeon  Anesthesia Plan Comments: (Discussed sedation and potential to need to place airway or ETT if warranted by clinical changes intra-operatively. We will start procedure as MAC.)        Anesthesia Quick Evaluation

## 2016-02-22 NOTE — Anesthesia Postprocedure Evaluation (Signed)
Anesthesia Post Note  Patient: Paul Mann  Procedure(s) Performed: Procedure(s) (LRB): COLONOSCOPY WITH PROPOFOL (N/A)  Patient location during evaluation: PACU Anesthesia Type: MAC Level of consciousness: awake and alert Pain management: pain level controlled Vital Signs Assessment: post-procedure vital signs reviewed and stable Respiratory status: spontaneous breathing, nonlabored ventilation, respiratory function stable and patient connected to nasal cannula oxygen Cardiovascular status: stable and blood pressure returned to baseline Anesthetic complications: no    Last Vitals:  Vitals:   02/22/16 1040 02/22/16 1050  BP: 123/77 (!) 155/87  Resp:    Temp:      Last Pain:  Vitals:   02/22/16 1021  TempSrc: Oral                 [name] EDWARD

## 2016-02-22 NOTE — Op Note (Signed)
[facility] Patient Name: Paul Mann Procedure Date: 02/22/2016 MRN: [id number] Attending MD: Garlan Fair , MD Date of Birth: [birth year] CSN: [id number] Age: 70 Admit Type: Outpatient Procedure:                Colonoscopy Indications:              High risk colon cancer surveillance: Personal                            history of non-advanced adenoma Providers:                Garlan Fair, MD, Laverta Baltimore RN, RN, Alfonso Patten, Technician, Dione Booze, CRNA Referring MD:              Medicines:                Propofol per Anesthesia Complications:            No immediate complications. Estimated Blood Loss:     Estimated blood loss: none. Procedure:                Pre-Anesthesia Assessment:                           - Prior to the procedure, a History and Physical                            was performed, and patient medications and                            allergies were reviewed. The patient's tolerance of                            previous anesthesia was also reviewed. The risks                            and benefits of the procedure and the sedation                            options and risks were discussed with the patient.                            All questions were answered, and informed consent                            was obtained. Prior Anticoagulants: The patient has                            taken aspirin, last dose was 1 day prior to                            procedure. ASA Grade Assessment: II - A patient  with mild systemic disease. After reviewing the                            risks and benefits, the patient was deemed in                            satisfactory condition to undergo the procedure.                           After obtaining informed consent, the colonoscope                            was passed under direct vision. Throughout the                             procedure, the patient's blood pressure, pulse, and                            oxygen saturations were monitored continuously. The                            EC-[id number]) scope was introduced through                            the anus and advanced to the the cecum, identified                            by appendiceal orifice and ileocecal valve. The                            colonoscopy was performed without difficulty. The                            patient tolerated the procedure well. The quality                            of the bowel preparation was good. The terminal                            ileum, the ileocecal valve, the appendiceal orifice                            and the rectum were photographed. Scope In: 9:50:41 AM Scope Out: 10:14:43 AM Scope Withdrawal Time: 0 hours 14 minutes 8 seconds  Total Procedure Duration: 0 hours 24 minutes 2 seconds  Findings:      The perianal and digital rectal examinations were normal.      Multiple small and large-mouthed diverticula were found in the sigmoid       colon.      The exam was otherwise without abnormality. Impression:               - Diverticulosis in the sigmoid colon.                           -  The examination was otherwise normal.                           - No specimens collected. Moderate Sedation:      N/A- Per Anesthesia Care Recommendation:           - Patient has a contact number available for                            emergencies. The signs and symptoms of potential                            delayed complications were discussed with the                            patient. Return to normal activities tomorrow.                            Written discharge instructions were provided to the                            patient.                           - Repeat colonoscopy in 5 years for surveillance.                           - Resume previous diet.                           - Continue present  medications. Procedure Code(s):        --- Professional ---                           [id number], Colorectal cancer screening; colonoscopy on                            individual at high risk Diagnosis Code(s):        --- Professional ---                           Z86.010, Personal history of colonic polyps                           K57.30, Diverticulosis of large intestine without                            perforation or abscess without bleeding CPT copyright 2016 [employer]. All rights reserved. The codes documented in this report are preliminary and upon coder review may  be revised to meet current compliance requirements. Earle Gell, MD Garlan Fair, MD 02/22/2016 10:21:20 AM This report has been signed electronically. Number of Addenda: 0

## 2016-02-22 NOTE — H&P (Signed)
  Procedure: Surveillance colonoscopy. 12/09/2008 colonoscopy with removal of three small adenomatous colon polyps was performed  History: The patient is a 70 year old male born 1946-04-29. He is scheduled to undergo a surveillance colonoscopy today.  Past medical history: Hypertension. Hypercholesterolemia. Allergic rhinitis. Colonic diverticulitis treated in September 2009 and January 2010. Basal cell skin cancer removed from his nose. Psoriasis osteoarthritis. Lumbar degenerative disc disease. Right wrist surgery. Tonsillectomy.  Medication allergies: Penicillin, Biaxin, ciprofloxacin caused rash. Statin drugs cause leg cramps. Codeine.  Exam: The patient is alert and lying comfortably on the endoscopy stretcher. Abdomen is soft and nontender to palpation. Lungs are clear to auscultation. Cardiac exam reveals a regular rhythm.  Plan: Proceed with surveillance colonoscopy

## 2016-02-23 ENCOUNTER — Encounter (HOSPITAL_COMMUNITY): Payer: Self-pay | Admitting: Gastroenterology

## 2016-03-12 IMAGING — CR DG LUMBAR SPINE 2-3V
3 series · 3 of 3 positions shown · non-contrast
Comparison: Lumbar spine series of April 21, 2011

CLINICAL DATA: Low back and left-sided sciatic symptoms extending
to the level of the toes; history of previous lumbar injection,
cortisone shots in the buttock today

EXAM:
LUMBAR SPINE - 2-3 VIEW

[t lumbar spine ap]
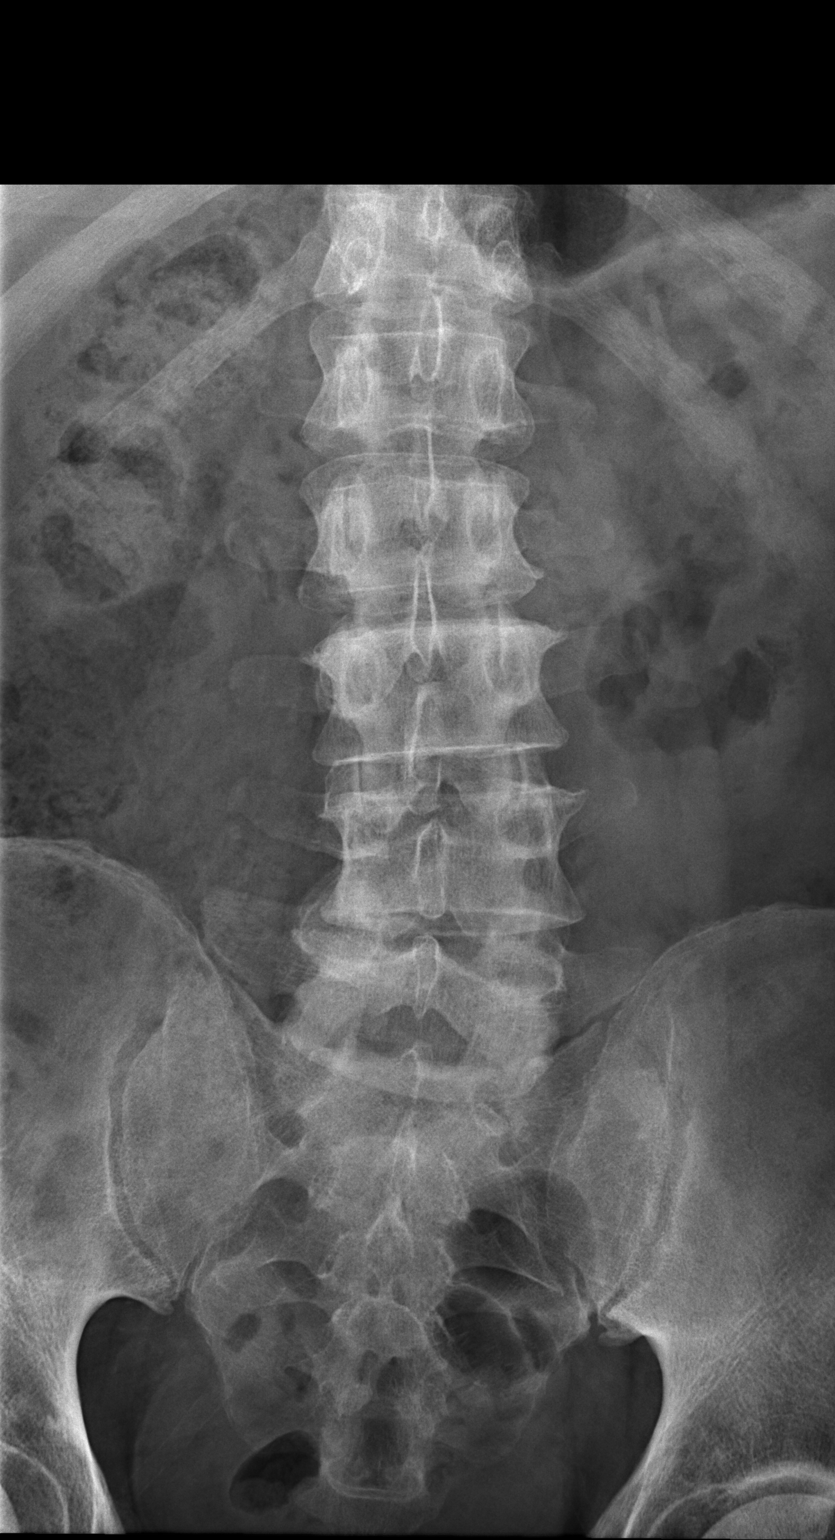

[t lumbar spine lat]
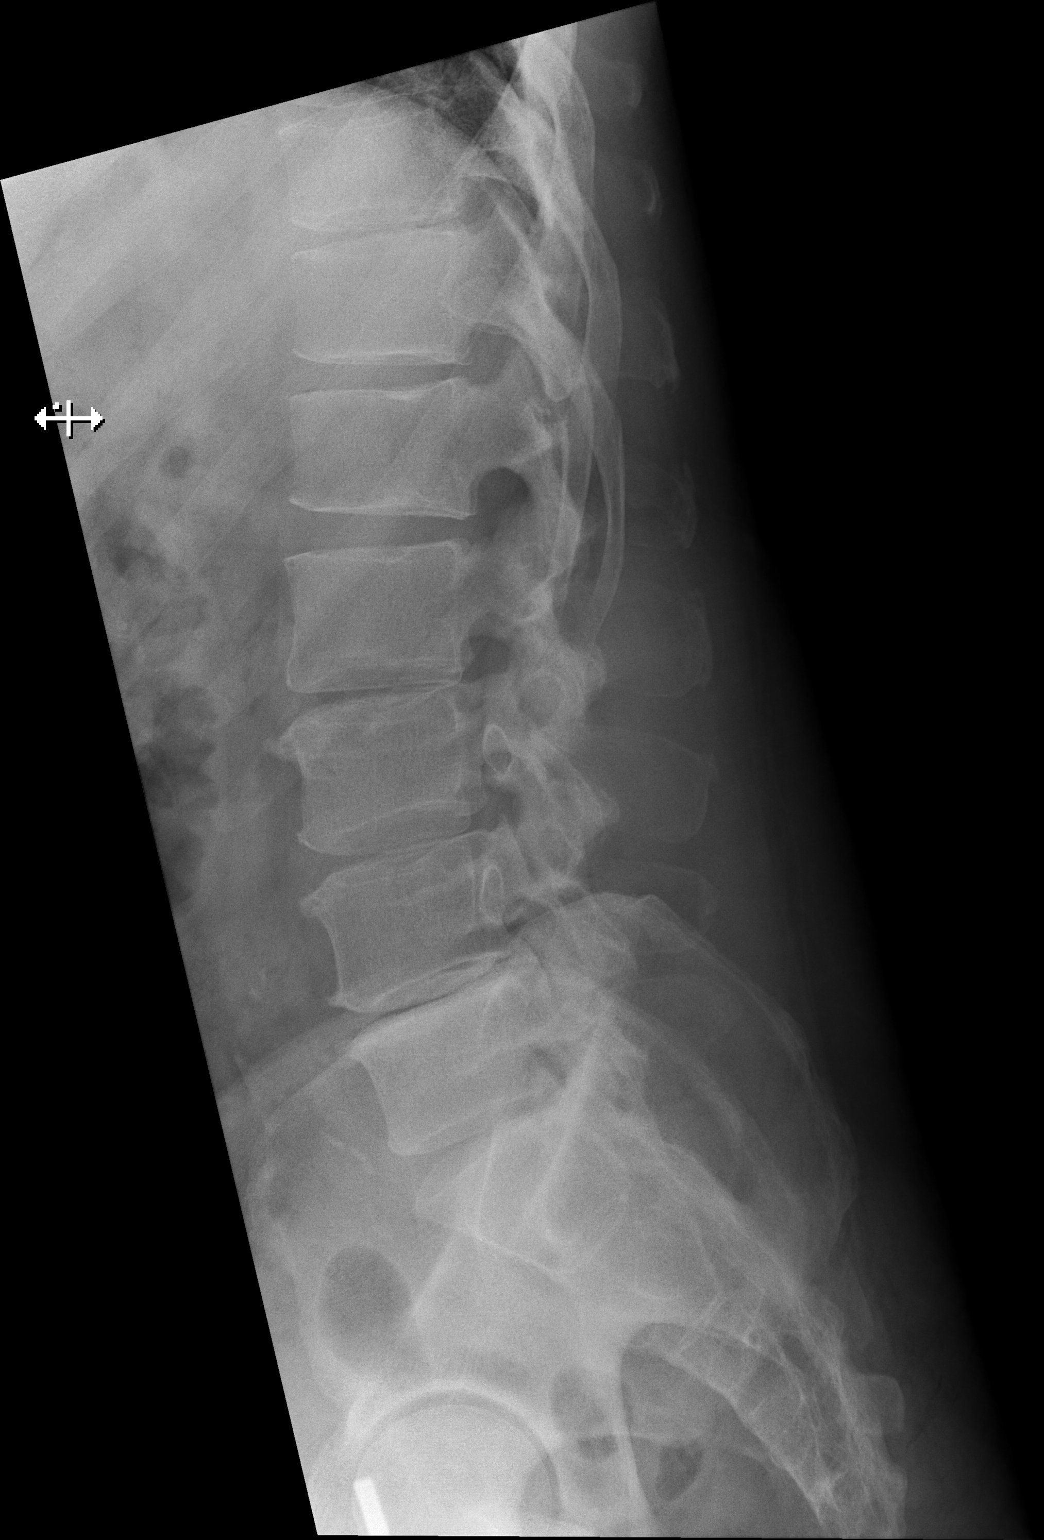

[t lumbar l-5 s-1 spot]
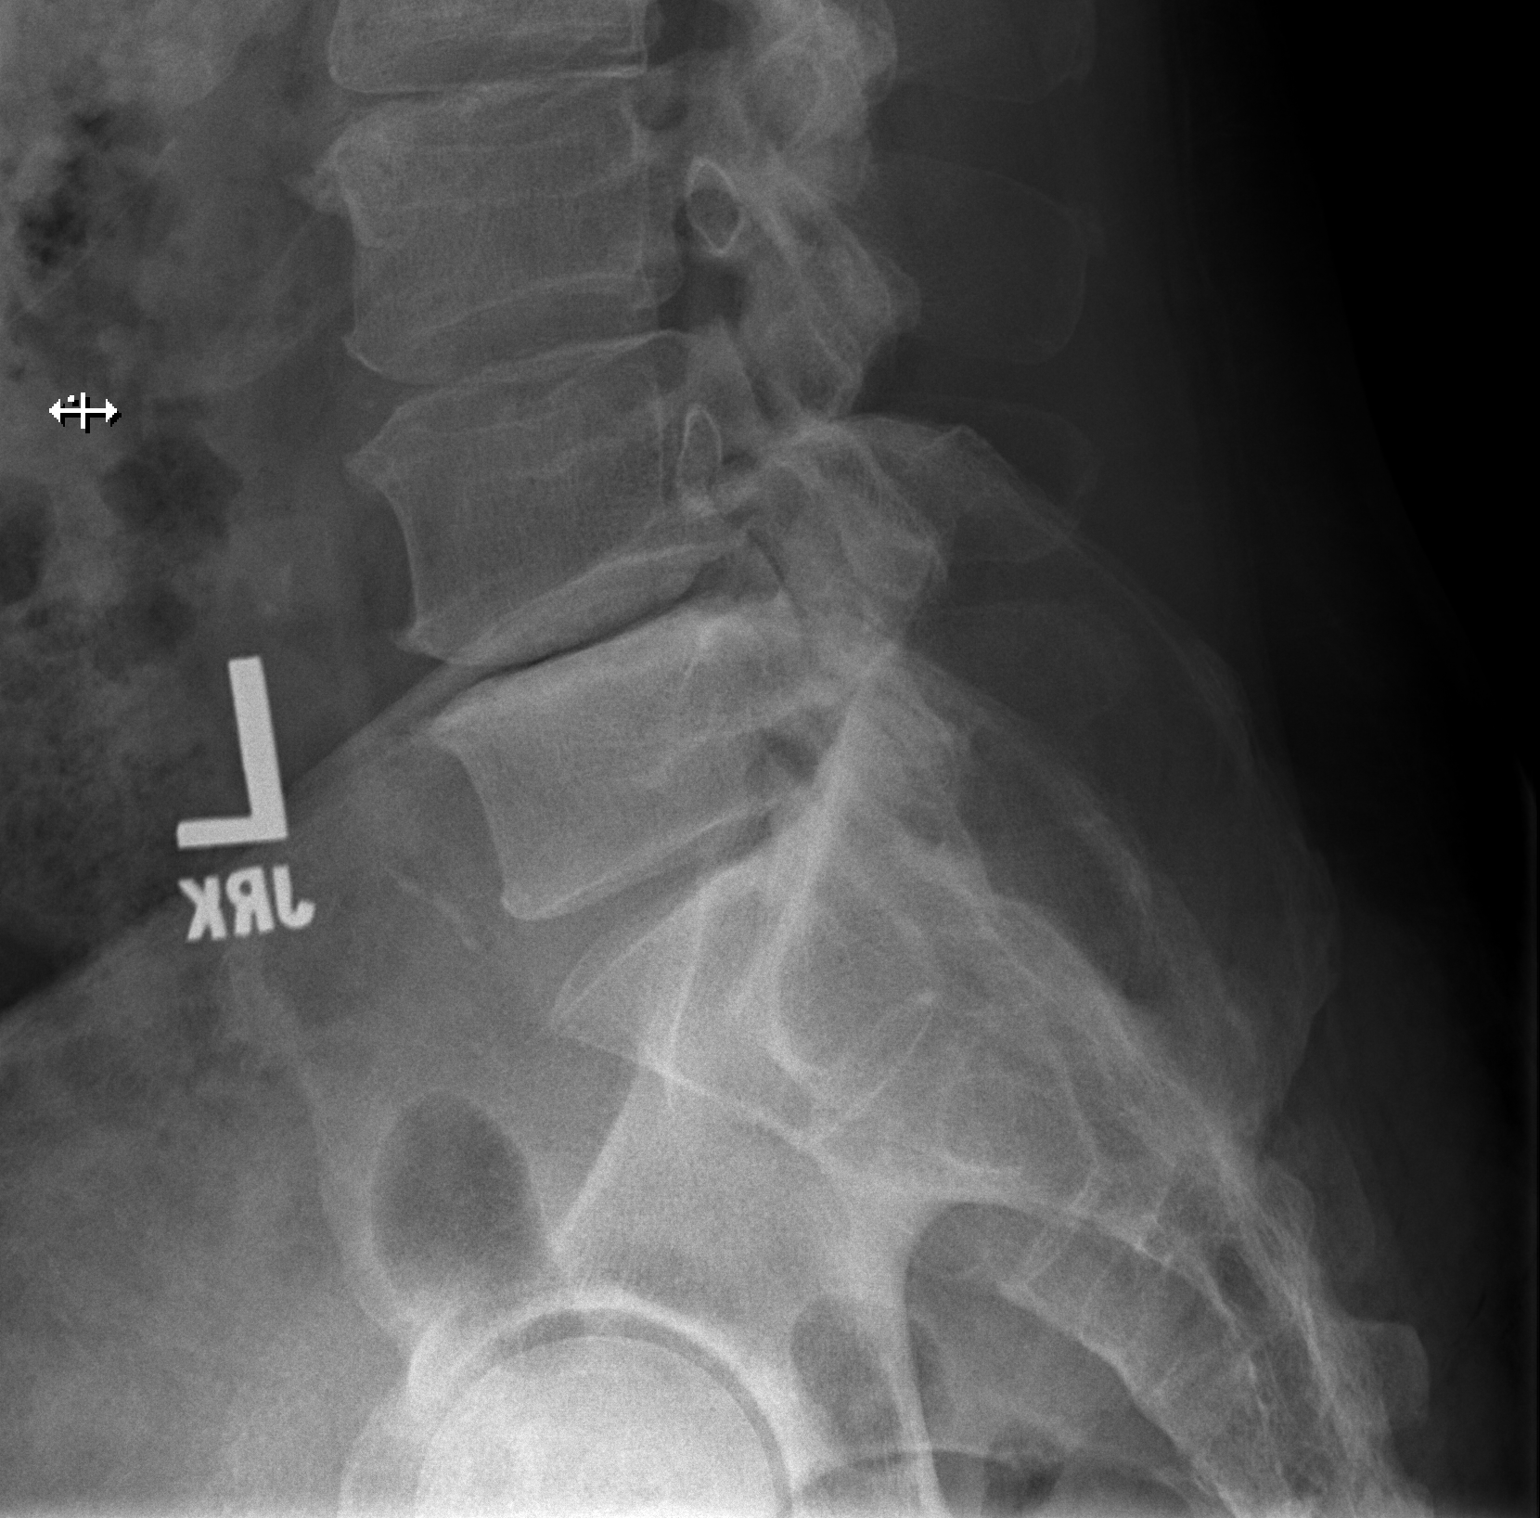

[3 of 3 positions shown; findings below may reference images not displayed]

FINDINGS: There is mild S-shaped thoracolumbar scoliosis slightly more
conspicuous today. The lumbar vertebral bodies are preserved in
height. There is disc space narrowing at L2-3, L4-5, and at L5-S1.
There is facet joint hypertrophy at L4-5 and L5-S1. The pedicles and
transverse processes are intact. There is no spondylolisthesis. The
observed portions of the sacrum are normal.
IMPRESSION: There is mild degenerative disc disease and facet joint hypertrophy
at multiple lumbar levels. The findings have progressed slightly
since 1651. There is no acute bony abnormality.

## 2016-04-18 ENCOUNTER — Other Ambulatory Visit: Payer: Self-pay | Admitting: *Deleted

## 2016-04-18 ENCOUNTER — Ambulatory Visit (INDEPENDENT_AMBULATORY_CARE_PROVIDER_SITE_OTHER): Payer: Medicare Other | Admitting: Sports Medicine

## 2016-04-18 ENCOUNTER — Encounter: Payer: Self-pay | Admitting: Sports Medicine

## 2016-04-18 VITALS — BP 133/56 | HR 69 | Ht 71.0 in | Wt 184.0 lb

## 2016-04-18 DIAGNOSIS — M25562 Pain in left knee: Secondary | ICD-10-CM

## 2016-04-18 MED ORDER — METHYLPREDNISOLONE ACETATE 40 MG/ML IJ SUSP
40.0000 mg | Freq: Once | INTRAMUSCULAR | Status: AC
  Administered 2016-04-18: 40 mg via INTRA_ARTICULAR

## 2016-04-18 NOTE — Progress Notes (Signed)
   Subjective:    Patient ID: Paul Mann, male    DOB: 1946/02/06, 70 y.o.   MRN: [id number]  HPI chief complaint: Left knee pain  Patient comes in today with returning left knee pain. He has a documented history of left knee DJD. A cortisone injection administered back in March provided him with good symptom relief up until recently. He has a golf trip planned at the end of this week and would like to have an injection before leaving. It has also been suggested to him that he try a round of Visco supplementation. He has not yet done that.    Review of Systems     Objective:   Physical Exam  Well-developed, well-nourished. No acute distress.  Left knee: Patient has about a 5 extension lag. Full flexion. No effusion. No joint line tenderness. Good stability. Neurovascularly intact distally.       Assessment & Plan:   Returning left knee pain secondary to DJD  Patient's left knee is injected today with cortisone. An anterior medial approach was utilized. He tolerates this without difficulty. I've also given him a body helix compression sleeve to wear with golfing. He will let me know if he needs a referral for Visco supplementation. Follow-up as needed.  Consent obtained and verified. Time-out conducted. Noted no overlying erythema, induration, or other signs of local infection. Skin prepped in a sterile fashion. Topical analgesic spray: Ethyl chloride. Joint: left knee Needle: 22g 1.5 inch Completed without difficulty. Meds: 3cc 1% xylocaine, 1cc (40mg ) depomedrol   Advised to call if fevers/chills, erythema, induration, drainage, or persistent bleeding.

## 2016-05-11 DIAGNOSIS — E782 Mixed hyperlipidemia: Secondary | ICD-10-CM | POA: Diagnosis not present

## 2016-05-11 DIAGNOSIS — K219 Gastro-esophageal reflux disease without esophagitis: Secondary | ICD-10-CM | POA: Diagnosis not present

## 2016-05-11 DIAGNOSIS — I10 Essential (primary) hypertension: Secondary | ICD-10-CM | POA: Diagnosis not present

## 2016-05-11 DIAGNOSIS — Z23 Encounter for immunization: Secondary | ICD-10-CM | POA: Diagnosis not present

## 2016-05-11 DIAGNOSIS — J309 Allergic rhinitis, unspecified: Secondary | ICD-10-CM | POA: Diagnosis not present

## 2016-05-11 DIAGNOSIS — N182 Chronic kidney disease, stage 2 (mild): Secondary | ICD-10-CM | POA: Diagnosis not present

## 2016-06-20 DIAGNOSIS — L570 Actinic keratosis: Secondary | ICD-10-CM | POA: Diagnosis not present

## 2016-06-20 DIAGNOSIS — L820 Inflamed seborrheic keratosis: Secondary | ICD-10-CM | POA: Diagnosis not present

## 2016-06-20 DIAGNOSIS — L918 Other hypertrophic disorders of the skin: Secondary | ICD-10-CM | POA: Diagnosis not present

## 2016-06-20 DIAGNOSIS — Z1283 Encounter for screening for malignant neoplasm of skin: Secondary | ICD-10-CM | POA: Diagnosis not present

## 2016-06-20 DIAGNOSIS — L812 Freckles: Secondary | ICD-10-CM | POA: Diagnosis not present

## 2016-06-20 DIAGNOSIS — D229 Melanocytic nevi, unspecified: Secondary | ICD-10-CM | POA: Diagnosis not present

## 2016-06-20 DIAGNOSIS — L821 Other seborrheic keratosis: Secondary | ICD-10-CM | POA: Diagnosis not present

## 2016-06-20 DIAGNOSIS — D1800 Hemangioma unspecified site: Secondary | ICD-10-CM | POA: Diagnosis not present

## 2016-06-20 DIAGNOSIS — L578 Other skin changes due to chronic exposure to nonionizing radiation: Secondary | ICD-10-CM | POA: Diagnosis not present

## 2016-06-20 DIAGNOSIS — Z85828 Personal history of other malignant neoplasm of skin: Secondary | ICD-10-CM | POA: Diagnosis not present

## 2016-07-22 DIAGNOSIS — H2513 Age-related nuclear cataract, bilateral: Secondary | ICD-10-CM | POA: Diagnosis not present

## 2016-10-17 DIAGNOSIS — L821 Other seborrheic keratosis: Secondary | ICD-10-CM | POA: Diagnosis not present

## 2016-10-17 DIAGNOSIS — Z85828 Personal history of other malignant neoplasm of skin: Secondary | ICD-10-CM | POA: Diagnosis not present

## 2016-10-17 DIAGNOSIS — D1800 Hemangioma unspecified site: Secondary | ICD-10-CM | POA: Diagnosis not present

## 2016-10-17 DIAGNOSIS — Z1283 Encounter for screening for malignant neoplasm of skin: Secondary | ICD-10-CM | POA: Diagnosis not present

## 2016-10-17 DIAGNOSIS — L570 Actinic keratosis: Secondary | ICD-10-CM | POA: Diagnosis not present

## 2016-10-17 DIAGNOSIS — D229 Melanocytic nevi, unspecified: Secondary | ICD-10-CM | POA: Diagnosis not present

## 2016-10-17 DIAGNOSIS — L812 Freckles: Secondary | ICD-10-CM | POA: Diagnosis not present

## 2016-10-17 DIAGNOSIS — L578 Other skin changes due to chronic exposure to nonionizing radiation: Secondary | ICD-10-CM | POA: Diagnosis not present

## 2016-11-16 DIAGNOSIS — R7309 Other abnormal glucose: Secondary | ICD-10-CM | POA: Diagnosis not present

## 2016-11-16 DIAGNOSIS — E782 Mixed hyperlipidemia: Secondary | ICD-10-CM | POA: Diagnosis not present

## 2016-11-16 DIAGNOSIS — J309 Allergic rhinitis, unspecified: Secondary | ICD-10-CM | POA: Diagnosis not present

## 2016-11-16 DIAGNOSIS — M519 Unspecified thoracic, thoracolumbar and lumbosacral intervertebral disc disorder: Secondary | ICD-10-CM | POA: Diagnosis not present

## 2016-11-16 DIAGNOSIS — Z0000 Encounter for general adult medical examination without abnormal findings: Secondary | ICD-10-CM | POA: Diagnosis not present

## 2016-11-16 DIAGNOSIS — N400 Enlarged prostate without lower urinary tract symptoms: Secondary | ICD-10-CM | POA: Diagnosis not present

## 2016-11-16 DIAGNOSIS — Z125 Encounter for screening for malignant neoplasm of prostate: Secondary | ICD-10-CM | POA: Diagnosis not present

## 2016-11-16 DIAGNOSIS — Z1159 Encounter for screening for other viral diseases: Secondary | ICD-10-CM | POA: Diagnosis not present

## 2016-11-16 DIAGNOSIS — I10 Essential (primary) hypertension: Secondary | ICD-10-CM | POA: Diagnosis not present

## 2016-11-16 DIAGNOSIS — Z1389 Encounter for screening for other disorder: Secondary | ICD-10-CM | POA: Diagnosis not present

## 2016-11-16 DIAGNOSIS — N182 Chronic kidney disease, stage 2 (mild): Secondary | ICD-10-CM | POA: Diagnosis not present

## 2016-11-16 DIAGNOSIS — K219 Gastro-esophageal reflux disease without esophagitis: Secondary | ICD-10-CM | POA: Diagnosis not present

## 2017-05-17 DIAGNOSIS — L570 Actinic keratosis: Secondary | ICD-10-CM | POA: Diagnosis not present

## 2017-05-17 DIAGNOSIS — D485 Neoplasm of uncertain behavior of skin: Secondary | ICD-10-CM | POA: Diagnosis not present

## 2017-05-17 DIAGNOSIS — C44319 Basal cell carcinoma of skin of other parts of face: Secondary | ICD-10-CM | POA: Diagnosis not present

## 2017-05-17 DIAGNOSIS — L578 Other skin changes due to chronic exposure to nonionizing radiation: Secondary | ICD-10-CM | POA: Diagnosis not present

## 2017-05-17 DIAGNOSIS — Z85828 Personal history of other malignant neoplasm of skin: Secondary | ICD-10-CM | POA: Diagnosis not present

## 2017-05-24 DIAGNOSIS — N182 Chronic kidney disease, stage 2 (mild): Secondary | ICD-10-CM | POA: Diagnosis not present

## 2017-05-24 DIAGNOSIS — Z23 Encounter for immunization: Secondary | ICD-10-CM | POA: Diagnosis not present

## 2017-05-24 DIAGNOSIS — N400 Enlarged prostate without lower urinary tract symptoms: Secondary | ICD-10-CM | POA: Diagnosis not present

## 2017-05-24 DIAGNOSIS — E782 Mixed hyperlipidemia: Secondary | ICD-10-CM | POA: Diagnosis not present

## 2017-05-24 DIAGNOSIS — I10 Essential (primary) hypertension: Secondary | ICD-10-CM | POA: Diagnosis not present

## 2017-05-24 DIAGNOSIS — J309 Allergic rhinitis, unspecified: Secondary | ICD-10-CM | POA: Diagnosis not present

## 2017-05-24 DIAGNOSIS — K219 Gastro-esophageal reflux disease without esophagitis: Secondary | ICD-10-CM | POA: Diagnosis not present

## 2017-06-19 DIAGNOSIS — L570 Actinic keratosis: Secondary | ICD-10-CM | POA: Diagnosis not present

## 2017-07-31 DIAGNOSIS — L820 Inflamed seborrheic keratosis: Secondary | ICD-10-CM | POA: Diagnosis not present

## 2017-07-31 DIAGNOSIS — L578 Other skin changes due to chronic exposure to nonionizing radiation: Secondary | ICD-10-CM | POA: Diagnosis not present

## 2017-07-31 DIAGNOSIS — L570 Actinic keratosis: Secondary | ICD-10-CM | POA: Diagnosis not present

## 2017-08-16 DIAGNOSIS — H2513 Age-related nuclear cataract, bilateral: Secondary | ICD-10-CM | POA: Diagnosis not present

## 2017-10-30 DIAGNOSIS — L821 Other seborrheic keratosis: Secondary | ICD-10-CM | POA: Diagnosis not present

## 2017-10-30 DIAGNOSIS — L570 Actinic keratosis: Secondary | ICD-10-CM | POA: Diagnosis not present

## 2017-10-30 DIAGNOSIS — L812 Freckles: Secondary | ICD-10-CM | POA: Diagnosis not present

## 2017-10-30 DIAGNOSIS — L578 Other skin changes due to chronic exposure to nonionizing radiation: Secondary | ICD-10-CM | POA: Diagnosis not present

## 2017-10-30 DIAGNOSIS — I788 Other diseases of capillaries: Secondary | ICD-10-CM | POA: Diagnosis not present

## 2017-11-20 DIAGNOSIS — I10 Essential (primary) hypertension: Secondary | ICD-10-CM | POA: Diagnosis not present

## 2017-11-20 DIAGNOSIS — N400 Enlarged prostate without lower urinary tract symptoms: Secondary | ICD-10-CM | POA: Diagnosis not present

## 2017-11-20 DIAGNOSIS — Z1389 Encounter for screening for other disorder: Secondary | ICD-10-CM | POA: Diagnosis not present

## 2017-11-20 DIAGNOSIS — N182 Chronic kidney disease, stage 2 (mild): Secondary | ICD-10-CM | POA: Diagnosis not present

## 2017-11-20 DIAGNOSIS — E782 Mixed hyperlipidemia: Secondary | ICD-10-CM | POA: Diagnosis not present

## 2017-11-20 DIAGNOSIS — R7309 Other abnormal glucose: Secondary | ICD-10-CM | POA: Diagnosis not present

## 2017-11-20 DIAGNOSIS — M519 Unspecified thoracic, thoracolumbar and lumbosacral intervertebral disc disorder: Secondary | ICD-10-CM | POA: Diagnosis not present

## 2017-11-20 DIAGNOSIS — K219 Gastro-esophageal reflux disease without esophagitis: Secondary | ICD-10-CM | POA: Diagnosis not present

## 2017-11-20 DIAGNOSIS — Z0000 Encounter for general adult medical examination without abnormal findings: Secondary | ICD-10-CM | POA: Diagnosis not present

## 2017-11-20 DIAGNOSIS — J309 Allergic rhinitis, unspecified: Secondary | ICD-10-CM | POA: Diagnosis not present

## 2018-05-21 DIAGNOSIS — J309 Allergic rhinitis, unspecified: Secondary | ICD-10-CM | POA: Diagnosis not present

## 2018-05-21 DIAGNOSIS — E782 Mixed hyperlipidemia: Secondary | ICD-10-CM | POA: Diagnosis not present

## 2018-05-21 DIAGNOSIS — I10 Essential (primary) hypertension: Secondary | ICD-10-CM | POA: Diagnosis not present

## 2018-05-21 DIAGNOSIS — M519 Unspecified thoracic, thoracolumbar and lumbosacral intervertebral disc disorder: Secondary | ICD-10-CM | POA: Diagnosis not present

## 2018-05-21 DIAGNOSIS — N182 Chronic kidney disease, stage 2 (mild): Secondary | ICD-10-CM | POA: Diagnosis not present

## 2018-05-21 DIAGNOSIS — Z23 Encounter for immunization: Secondary | ICD-10-CM | POA: Diagnosis not present

## 2018-07-04 DIAGNOSIS — L578 Other skin changes due to chronic exposure to nonionizing radiation: Secondary | ICD-10-CM | POA: Diagnosis not present

## 2018-07-04 DIAGNOSIS — Z1283 Encounter for screening for malignant neoplasm of skin: Secondary | ICD-10-CM | POA: Diagnosis not present

## 2018-07-04 DIAGNOSIS — Z85828 Personal history of other malignant neoplasm of skin: Secondary | ICD-10-CM | POA: Diagnosis not present

## 2018-07-04 DIAGNOSIS — C4442 Squamous cell carcinoma of skin of scalp and neck: Secondary | ICD-10-CM | POA: Diagnosis not present

## 2018-07-04 DIAGNOSIS — C44529 Squamous cell carcinoma of skin of other part of trunk: Secondary | ICD-10-CM

## 2018-07-04 DIAGNOSIS — L821 Other seborrheic keratosis: Secondary | ICD-10-CM | POA: Diagnosis not present

## 2018-07-04 DIAGNOSIS — L570 Actinic keratosis: Secondary | ICD-10-CM | POA: Diagnosis not present

## 2018-07-04 DIAGNOSIS — D225 Melanocytic nevi of trunk: Secondary | ICD-10-CM | POA: Diagnosis not present

## 2018-07-04 DIAGNOSIS — L820 Inflamed seborrheic keratosis: Secondary | ICD-10-CM | POA: Diagnosis not present

## 2018-07-04 DIAGNOSIS — L812 Freckles: Secondary | ICD-10-CM | POA: Diagnosis not present

## 2018-07-04 DIAGNOSIS — D485 Neoplasm of uncertain behavior of skin: Secondary | ICD-10-CM | POA: Diagnosis not present

## 2018-07-04 HISTORY — DX: Squamous cell carcinoma of skin of other part of trunk: C44.529

## 2018-08-14 DIAGNOSIS — L570 Actinic keratosis: Secondary | ICD-10-CM | POA: Diagnosis not present

## 2018-08-17 DIAGNOSIS — H18413 Arcus senilis, bilateral: Secondary | ICD-10-CM | POA: Diagnosis not present

## 2019-05-15 DIAGNOSIS — Z23 Encounter for immunization: Secondary | ICD-10-CM | POA: Diagnosis not present

## 2019-06-04 ENCOUNTER — Other Ambulatory Visit: Payer: Self-pay

## 2019-06-04 DIAGNOSIS — Z20822 Contact with and (suspected) exposure to covid-19: Secondary | ICD-10-CM

## 2019-06-06 LAB — NOVEL CORONAVIRUS, NAA: SARS-CoV-2, NAA: NOT DETECTED

## 2019-10-21 ENCOUNTER — Ambulatory Visit (INDEPENDENT_AMBULATORY_CARE_PROVIDER_SITE_OTHER): Payer: Medicare Other | Admitting: Dermatology

## 2019-10-21 ENCOUNTER — Other Ambulatory Visit: Payer: Self-pay

## 2019-10-21 DIAGNOSIS — D485 Neoplasm of uncertain behavior of skin: Secondary | ICD-10-CM

## 2019-10-21 DIAGNOSIS — L820 Inflamed seborrheic keratosis: Secondary | ICD-10-CM

## 2019-10-21 DIAGNOSIS — L821 Other seborrheic keratosis: Secondary | ICD-10-CM | POA: Diagnosis not present

## 2019-10-21 DIAGNOSIS — L578 Other skin changes due to chronic exposure to nonionizing radiation: Secondary | ICD-10-CM | POA: Diagnosis not present

## 2019-10-21 DIAGNOSIS — L570 Actinic keratosis: Secondary | ICD-10-CM | POA: Diagnosis not present

## 2019-10-21 DIAGNOSIS — C4442 Squamous cell carcinoma of skin of scalp and neck: Secondary | ICD-10-CM | POA: Diagnosis not present

## 2019-10-21 NOTE — Progress Notes (Signed)
Follow-Up Visit   Subjective  Paul Mann is a 74 y.o. male who presents for the following: Follow-up (AK follow up - face. Treated with LN2 x 20.).  Patient has history of multiple skin cancers and precancers treated over many years.  He has noticed a sore on his right neck.  It has been crusting and nonhealing for several weeks.  He has several other spots on his face and neck and scalp for which he would like evaluated and treated.  They have been sore and rough and irritating.  The following portions of the chart were reviewed this encounter and updated as appropriate:     Review of Systems: No other skin or systemic complaints.  Objective  Well appearing patient in no apparent distress; mood and affect are within normal limits.  A focused examination was performed including face. Relevant physical exam findings are noted in the Assessment and Plan.  Objective  face, ears (18): Erythematous thin papules/macules with gritty scale.   Objective  Head - Anterior (Face): Diffuse scaly erythematous macules with underlying dyspigmentation.   Objective  hands (22): Erythematous keratotic or waxy stuck-on papule or plaque.   Objective  Right lat neck/infra auricular: 0.8 cm crusted papule.  Objective  Arms, hands: Stuck-on, waxy, tan-brown papule or plaque --Discussed benign etiology and prognosis.   Assessment & Plan  AK (actinic keratosis) (18) face, ears  Destruction of lesion - face, ears Complexity: simple   Destruction method: cryotherapy   Informed consent: discussed and consent obtained   Timeout:  patient name, date of birth, surgical site, and procedure verified Lesion destroyed using liquid nitrogen: Yes   Region frozen until ice ball extended beyond lesion: Yes   Outcome: patient tolerated procedure well with no complications   Post-procedure details: wound care instructions given    Actinic skin damage Head - Anterior (Face)  Spf 30+  Inflamed  seborrheic keratosis (22) hands  Destruction of lesion - hands Complexity: simple   Destruction method: cryotherapy   Informed consent: discussed and consent obtained   Timeout:  patient name, date of birth, surgical site, and procedure verified Lesion destroyed using liquid nitrogen: Yes   Region frozen until ice ball extended beyond lesion: Yes   Outcome: patient tolerated procedure well with no complications   Post-procedure details: wound care instructions given    Neoplasm of uncertain behavior of skin Right lat neck/infra auricular  Epidermal / dermal shaving  Lesion length (cm):  0.8 Lesion width (cm):  0.8 Margin per side (cm):  0.2 Total excision diameter (cm):  1.2 Informed consent: discussed and consent obtained   Timeout: patient name, date of birth, surgical site, and procedure verified   Procedure prep:  Patient was prepped and draped in usual sterile fashion Prep type:  Isopropyl alcohol Anesthesia: the lesion was anesthetized in a standard fashion   Anesthetic:  1% lidocaine w/ epinephrine 1-100,000 nerve block Instrument used: flexible razor blade   Hemostasis achieved with: pressure, aluminum chloride and electrodesiccation   Outcome: patient tolerated procedure well   Post-procedure details: sterile dressing applied and wound care instructions given   Dressing type: bandage and petrolatum    Destruction of lesion Complexity: extensive   Destruction method: electrodesiccation and curettage   Informed consent: discussed and consent obtained   Curettage performed in three different directions: Yes   Electrodesiccation performed over the curetted area: Yes   Hemostasis achieved with:  pressure, aluminum chloride and electrodesiccation Outcome: patient tolerated procedure well with no  complications   Post-procedure details: sterile dressing applied and wound care instructions given   Dressing type: bandage and petrolatum    Specimen 1 - Surgical  pathology Differential Diagnosis: SCC vs BCC vs other Check Margins: No 0.8 cm crusted papule.  Seborrheic keratosis Arms, hands  Observe    Return in about 3 months (around 01/21/2020).   I, Ashok Cordia, CMA, am acting as scribe for Sarina Ser, MD .'

## 2019-10-21 NOTE — Patient Instructions (Signed)

## 2019-10-24 ENCOUNTER — Telehealth: Payer: Self-pay

## 2019-10-24 NOTE — Telephone Encounter (Signed)
Patient informed of pathology results via Mychart and telephone.

## 2019-12-11 DIAGNOSIS — M519 Unspecified thoracic, thoracolumbar and lumbosacral intervertebral disc disorder: Secondary | ICD-10-CM | POA: Diagnosis not present

## 2019-12-11 DIAGNOSIS — Z0000 Encounter for general adult medical examination without abnormal findings: Secondary | ICD-10-CM | POA: Diagnosis not present

## 2019-12-11 DIAGNOSIS — N182 Chronic kidney disease, stage 2 (mild): Secondary | ICD-10-CM | POA: Diagnosis not present

## 2019-12-11 DIAGNOSIS — J309 Allergic rhinitis, unspecified: Secondary | ICD-10-CM | POA: Diagnosis not present

## 2019-12-11 DIAGNOSIS — R7309 Other abnormal glucose: Secondary | ICD-10-CM | POA: Diagnosis not present

## 2019-12-11 DIAGNOSIS — K219 Gastro-esophageal reflux disease without esophagitis: Secondary | ICD-10-CM | POA: Diagnosis not present

## 2019-12-11 DIAGNOSIS — E782 Mixed hyperlipidemia: Secondary | ICD-10-CM | POA: Diagnosis not present

## 2019-12-11 DIAGNOSIS — N400 Enlarged prostate without lower urinary tract symptoms: Secondary | ICD-10-CM | POA: Diagnosis not present

## 2019-12-11 DIAGNOSIS — I10 Essential (primary) hypertension: Secondary | ICD-10-CM | POA: Diagnosis not present

## 2019-12-11 DIAGNOSIS — Z1389 Encounter for screening for other disorder: Secondary | ICD-10-CM | POA: Diagnosis not present

## 2020-01-20 ENCOUNTER — Ambulatory Visit: Payer: Medicare Other | Admitting: Dermatology

## 2020-02-18 ENCOUNTER — Ambulatory Visit (INDEPENDENT_AMBULATORY_CARE_PROVIDER_SITE_OTHER): Payer: Medicare Other | Admitting: Dermatology

## 2020-02-18 ENCOUNTER — Encounter: Payer: Self-pay | Admitting: Dermatology

## 2020-02-18 ENCOUNTER — Other Ambulatory Visit: Payer: Self-pay

## 2020-02-18 DIAGNOSIS — Z85828 Personal history of other malignant neoplasm of skin: Secondary | ICD-10-CM

## 2020-02-18 DIAGNOSIS — L570 Actinic keratosis: Secondary | ICD-10-CM

## 2020-02-18 DIAGNOSIS — L578 Other skin changes due to chronic exposure to nonionizing radiation: Secondary | ICD-10-CM

## 2020-02-18 DIAGNOSIS — L820 Inflamed seborrheic keratosis: Secondary | ICD-10-CM | POA: Diagnosis not present

## 2020-02-18 MED ORDER — FLUOROURACIL 5 % EX CREA: TOPICAL_CREAM | CUTANEOUS | 1 refills | Status: DC

## 2020-02-18 NOTE — Progress Notes (Signed)
   Follow-Up Visit   Subjective  Paul Mann is a 74 y.o. male who presents for the following: Follow-up (Cryotherapy and SCC).  Patient presents today for follow up on OV 10/21/19, received cryotherapy for AK x 18 face and Ears and ISK x 22 hands. Also had shave removal biopsy and ED & C  at that time for [facility] on Right lateral neck infra auricular.  The following portions of the chart were reviewed this encounter and updated as appropriate:  Tobacco  Allergies  Meds  Problems  Med Hx  Surg Hx  Fam Hx     Review of Systems:  No other skin or systemic complaints except as noted in HPI or Assessment and Plan.  Objective  Well appearing patient in no apparent distress; mood and affect are within normal limits.  A focused examination was performed including upper extremities, including the arms, hands, fingers, and fingernails and head, including the scalp, face, neck, nose, ears, eyelids, and lips. Relevant physical exam findings are noted in the Assessment and Plan.  Objective  Left Lateral Neck infra auricular: Well healed scar with no evidence of recurrence, no lymphadenopathy.   Objective  Face and ears (21): Erythematous thin papules/macules with gritty scale.   Objective  Face, hands, and arm x 10 (10): Erythematous keratotic or waxy stuck-on papule or plaque.    Assessment & Plan    History of SCC (squamous cell carcinoma) of skin Left Lateral Neck infra auricular  Clear. Observe for recurrence. Call clinic for new or changing lesions.  Recommend regular skin exams, daily broad-spectrum spf 30+ sunscreen use, and photoprotection.     AK (actinic keratosis) (21) Face and ears  Recommend 5 F/U cream Start October 1 daily to face and ears in Evening for 3 weeks  fluorouracil (EFUDEX) 5 % cream - Face and ears  Destruction of lesion - Face and ears Complexity: simple   Destruction method: cryotherapy   Informed consent: discussed and consent obtained     Timeout:  patient name, date of birth, surgical site, and procedure verified Lesion destroyed using liquid nitrogen: Yes   Region frozen until ice ball extended beyond lesion: Yes   Outcome: patient tolerated procedure well with no complications   Post-procedure details: wound care instructions given    Inflamed seborrheic keratosis (10) Face, hands, and arm x 10  Destruction of lesion - Face, hands, and arm x 10 Complexity: simple   Destruction method: cryotherapy   Informed consent: discussed and consent obtained   Timeout:  patient name, date of birth, surgical site, and procedure verified Lesion destroyed using liquid nitrogen: Yes   Region frozen until ice ball extended beyond lesion: Yes   Outcome: patient tolerated procedure well with no complications   Post-procedure details: wound care instructions given    Actinic Damage - diffuse scaly erythematous macules with underlying dyspigmentation - Recommend daily broad spectrum sunscreen SPF 30+ to sun-exposed areas, reapply every 2 hours as needed.  - Call for new or changing lesions.  Return in about 6 months (around 08/20/2020) for UBSE.  I, Donzetta Kohut, CMA, am acting as scribe for Sarina Ser, MD . Documentation: I have reviewed the above documentation for accuracy and completeness, and I agree with the above.  Sarina Ser, MD

## 2020-02-18 NOTE — Patient Instructions (Signed)

## 2020-02-21 ENCOUNTER — Encounter: Payer: Self-pay | Admitting: Dermatology

## 2020-04-07 DIAGNOSIS — G2581 Restless legs syndrome: Secondary | ICD-10-CM | POA: Diagnosis not present

## 2020-05-06 DIAGNOSIS — Z23 Encounter for immunization: Secondary | ICD-10-CM | POA: Diagnosis not present

## 2020-06-15 DIAGNOSIS — M519 Unspecified thoracic, thoracolumbar and lumbosacral intervertebral disc disorder: Secondary | ICD-10-CM | POA: Diagnosis not present

## 2020-06-15 DIAGNOSIS — Z23 Encounter for immunization: Secondary | ICD-10-CM | POA: Diagnosis not present

## 2020-06-15 DIAGNOSIS — N182 Chronic kidney disease, stage 2 (mild): Secondary | ICD-10-CM | POA: Diagnosis not present

## 2020-06-15 DIAGNOSIS — K219 Gastro-esophageal reflux disease without esophagitis: Secondary | ICD-10-CM | POA: Diagnosis not present

## 2020-06-15 DIAGNOSIS — G2581 Restless legs syndrome: Secondary | ICD-10-CM | POA: Diagnosis not present

## 2020-06-15 DIAGNOSIS — R7309 Other abnormal glucose: Secondary | ICD-10-CM | POA: Diagnosis not present

## 2020-06-15 DIAGNOSIS — E782 Mixed hyperlipidemia: Secondary | ICD-10-CM | POA: Diagnosis not present

## 2020-06-15 DIAGNOSIS — I10 Essential (primary) hypertension: Secondary | ICD-10-CM | POA: Diagnosis not present

## 2020-06-15 DIAGNOSIS — J309 Allergic rhinitis, unspecified: Secondary | ICD-10-CM | POA: Diagnosis not present

## 2020-07-01 DIAGNOSIS — I7091 Generalized atherosclerosis: Secondary | ICD-10-CM | POA: Diagnosis not present

## 2020-07-01 DIAGNOSIS — R0609 Other forms of dyspnea: Secondary | ICD-10-CM | POA: Diagnosis not present

## 2020-07-01 DIAGNOSIS — R0789 Other chest pain: Secondary | ICD-10-CM | POA: Diagnosis not present

## 2020-07-28 ENCOUNTER — Ambulatory Visit: Payer: Medicare Other | Admitting: Cardiology

## 2020-08-06 NOTE — Progress Notes (Signed)
Cardiology Office Note:    Date:  08/07/2020   ID:  Paul Mann, DOB 1946-05-25, MRN [id number]  PCP:  Wenda Low, MD  Cardiologist:  No primary care provider on file.   Referring MD: Wenda Low, MD   Chief Complaint  Patient presents with  . Coronary Artery Disease  . Hypertension    History of Present Illness:    Paul Mann is a 75 y.o. male with a hx of  Non-obstructive CAD geography 2002 (40% mid LAD and mid RCA), with recent DOE and chest tightness.  Hypertension, hyperlipidemia, and physical inactivity of the risk factors.  There is a long family history of CAD that included his father who was a patient, his mother, and his sister and release.  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  Over the last 6 to 18 months, the patient has noticed increasing episodes of dyspnea on exertion, decreased exertional tolerance, and occasional feeling of tightness in the chest.  There has not been dramatic worsening but he is concerned that it may represent some progression of coronary disease.  He is dramatically deconditioned because of her chronic back problem.  He gets no regular exercise.  His symptoms do not occur at rest.  Past Medical History:  Diagnosis Date  . Actinic keratosis   . Arthritis   . Basal cell carcinoma 11/03/2008   Left med. clavicle  . Basal cell carcinoma 09/28/2009   Left anterior neck  . Basal cell carcinoma 09/28/2009   Left medial clavicle. Excised: 01/18/2010  . Basal cell carcinoma 01/18/2010   Right post. top of shoulder  . Basal cell carcinoma 01/18/2010   Left med. clavicle. Excised: 03/03/2010  . Basal cell carcinoma 07/09/2013   Left postauricular  . Basal cell carcinoma 07/09/2013   left lateral neck  . Basal cell carcinoma of face 07/02/2014   Nodular pattern. Left temple  . Basal cell carcinoma of face 05/17/2017   Nodular pattern. Right Zygoma. Tx: EDC  . Basal cell carcinoma of trunk 01/18/2010   Superficial. Left mid to upper  back, ~5.0cm lat. to spine.  . Basal cell carcinoma of trunk 01/18/2010   Right mid to upper back, 4.0cm lat. to spine  . Basal cell carcinoma of trunk 03/04/2015   Superficial. RIght low back lateral.  . Basal cell carcinoma of trunk 02/10/2016   Superficial. Right upper back  . Basal cell carcinoma of trunk 02/10/2016   Superficial. Left upper back.  . 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 pretibial just below knee  . Cancer [facility])    akin cancer areas removed  . Coronary artery disease   . Diverticulitis   . DJD (degenerative joint disease)    bulging disc upper and lower back  . Elevated cholesterol   . GERD (gastroesophageal reflux disease)   . Hypertension   . Squamous cell carcinoma of face 09/28/2009   SCCis. Left forehead.  . Squamous cell carcinoma of skin 11/03/2008   Well differentiated SCC. Left cheek/mid mandible. Excised: 12/10/2008  . Squamous cell carcinoma of skin 10/21/2019   right lat neck/infra auricular  . Squamous cell carcinoma of trunk 07/04/2018   Well differentiated SCC with superficial infiltration. Left suprasternal notch. Tx: EDC.    Past Surgical History:  Procedure Laterality Date  . COLONOSCOPY WITH PROPOFOL N/A 02/22/2016   Procedure: COLONOSCOPY WITH PROPOFOL;  Surgeon: Garlan Fair, MD;  Location: [facility];  Service: Endoscopy;  Laterality: N/A;  . moes  procedure for skin cancer 2016    . skin cancer area removed  02/10/2016   upper back and and face  . tumor rmoved from right hand  1980   right hand  . wrist surgery Right     Current Medications: Current Meds  Medication Sig  . amLODipine-benazepril (LOTREL) 5-20 MG per capsule Take 1 capsule by mouth daily.   Marland Kitchen aspirin EC 81 MG tablet Take 81 mg by mouth daily.   . cetirizine (ZYRTEC) 10 MG tablet Take 10 mg by mouth daily as needed for allergies.  . clonazePAM (KLONOPIN) 0.5 MG tablet Take 0.5 mg by mouth daily as needed.  . docusate sodium (COLACE) 100 MG  capsule Take 100 mg by mouth as needed.  . fluorouracil (EFUDEX) 5 % cream Apply thin coat to Face and ears once daily in the evening for 3 weeks, To start on October 1st. Wash off in morning  . fluticasone (FLONASE) 50 MCG/ACT nasal spray Place 1 spray into both nostrils daily.   Marland Kitchen lovastatin (MEVACOR) 10 MG tablet Take 10 mg by mouth at bedtime.  Marland Kitchen MAGNESIUM PO Take by mouth.  . meloxicam (MOBIC) 15 MG tablet Take 15 mg by mouth daily.   . metoprolol tartrate (LOPRESSOR) 50 MG tablet Take one tablet by mouth 2 hours prior to your CT  . Omega-3 Fatty Acids (FISH OIL) 1200 MG CPDR Take 1,200 mg by mouth daily.  . pantoprazole (PROTONIX) 40 MG tablet Take 40 mg by mouth daily.   Marland Kitchen POTASSIUM GLUCONATE PO Take 1 tablet by mouth as needed.     Allergies:   Ciprofloxacin, Clarithromycin, Codeine, Penicillins, Uroxatral [alfuzosin], and Zocor [simvastatin]   Social History   Socioeconomic History  . Marital status: Married    Spouse name: Not on file  . Number of children: Not on file  . Years of education: Not on file  . Highest education level: Not on file  Occupational History  . Not on file  Tobacco Use  . Smoking status: Never Smoker  . Smokeless tobacco: Never Used  Substance and Sexual Activity  . Alcohol use: No  . Drug use: No  . Sexual activity: Not on file  Other Topics Concern  . Not on file  Social History Narrative  . Not on file   Social Determinants of Health   Financial Resource Strain: Not on file  Food Insecurity: Not on file  Transportation Needs: Not on file  Physical Activity: Not on file  Stress: Not on file  Social Connections: Not on file     Family History: The patient's family history includes Heart attack in his father; Heart disease in his father and mother.  ROS:   Please see the history of present illness.    He has been intolerant to certain cholesterol medications.  Currently on Mevacor 10 mg/day.  Last LDL was 129.  He has acid reflux and  takes pantoprazole.  The acid reflux is poorly characterized.  He has chronic back pain for greater than 15 years that decreases his ability to be physically active.  He does not sleep well and he snores.  There is no diagnosis of sleep apnea.  The wife notes that he snores as well.  His legs cramp at night.  All other systems reviewed and are negative.  EKGs/Labs/Other Studies Reviewed:    The following studies were reviewed today: Laboratory data of 12/11/2019: LDL 127; triglyceride 131; hemoglobin A1c 5.8; creatinine 1.12.  EKG:  EKG sinus  bradycardia at 57 bpm.  U wave is noted.  Recent Labs: No results found for requested labs within last 8760 hours.  Recent Lipid Panel No results found for: CHOL, TRIG, HDL, CHOLHDL, VLDL, LDLCALC, LDLDIRECT  Physical Exam:    VS:  BP (!) 162/68   Pulse (!) 57   Ht 5' 10"  (1.778 m)   Wt 193 lb (87.5 kg)   SpO2 98%   BMI 27.69 kg/m     Wt Readings from Last 3 Encounters:  08/07/20 193 lb (87.5 kg)  04/18/16 184 lb (83.5 kg)  02/22/16 184 lb (83.5 kg)     GEN: Overweight. No acute distress HEENT: Normal NECK: No JVD. LYMPHATICS: No lymphadenopathy CARDIAC: No murmur. RRR S4 but no gallop, or edema. VASCULAR:  Normal Pulses.  He has diminished popliteal and pedal pulses bilaterally.  Femoral pulses are 2+ without bruits. RESPIRATORY:  Clear to auscultation without rales, wheezing or rhonchi  ABDOMEN: Soft, non-tender, non-distended, No pulsatile mass, MUSCULOSKELETAL: No deformity  SKIN: Warm and dry NEUROLOGIC:  Alert and oriented x 3 PSYCHIATRIC:  Normal affect   ASSESSMENT:    1. CAD in native artery   2. Chest tightness   3. DOE (dyspnea on exertion)   4. Primary hypertension   5. Hyperlipidemia with target LDL less than 70   6. Educated about COVID-19 virus infection   7. Precordial pain   8. PAD (peripheral artery disease) (HCC)    PLAN:    In order of problems listed above:  1. Remote coronary angiogram, 2002.  He has  a 1 to 2-year history of exertional fatigue, dyspnea, and at times vague chest discomfort.  Coronary CTA with FFR if indicated is needed to rule out progression of coronary disease.  He had mild nonobstructive disease at the time of cath in 2002.  Risk factors have been poorly controlled. 2. Discomfort is vaguely described by patient.  It is variable in occurrence. 3. This is the major complaint.  He has no orthopnea or swelling.  Perhaps this is an ischemic equivalent. 4. Blood pressure is elevated today.  He does not monitor.  Much at home.  Target is 130/80 mmHg.  He takes antihypertensive therapy in the evening.  He will record pressures each morning for the next 7 days and notify us of the results.  I anticipate he will need to have up titration of his antihypertensive regimen.  Perhaps adding chlorthalidone would be most appropriate. 5. He is on low intensity statin therapy and last LDL was 127.  This needs to be intensified.  Depending upon findings of coronary CTA, we will intensify therapy and or refer for nonstatin management with PCSK9 if indicated. 6. Vaccinated, boosted, and practicing medication. 7. Could not palpate pedal pulses.  He complains a lot of his legs hurting and assumed to be his lower back.  If no prior lower extremity Doppler evaluation, this should be considered.  I will discuss this with him in more detail after we know the results of the coronary CT.  Overall education and awareness concerning primary risk prevention was discussed in detail: LDL less than 70, hemoglobin A1c less than 7, blood pressure target less than 130/80 mmHg, >150 minutes of moderate aerobic activity per week, avoidance of smoking, weight control (via diet and exercise), and continued surveillance/management of/for obstructive sleep apnea.     Medication Adjustments/Labs and Tests Ordered: Current medicines are reviewed at length with the patient today.  Concerns regarding medicines are outlined  above.  Orders Placed This Encounter  Procedures  . CT CORONARY MORPH W/CTA COR W/SCORE W/CA W/CM &/OR WO/CM  . Basic metabolic panel  . EKG 12-Lead   Meds ordered this encounter  Medications  . metoprolol tartrate (LOPRESSOR) 50 MG tablet    Sig: Take one tablet by mouth 2 hours prior to your CT    Dispense:  1 tablet    Refill:  0    Patient Instructions  Medication Instructions:  Your physician recommends that you continue on your current medications as directed. Please refer to the Current Medication list given to you today.  *If you need a refill on your cardiac medications before your next appointment, please call your pharmacy*   Lab Work: BMET If you have labs (blood work) drawn today and your tests are completely normal, you will receive your results only by: Marland Kitchen MyChart Message (if you have MyChart) OR . A paper copy in the mail If you have any lab test that is abnormal or we need to change your treatment, we will call you to review the results.   Testing/Procedures: Your physician recommends that you have a Coronary CT performed.   Follow-Up: At [facility], you and your health needs are our priority.  As part of our continuing mission to provide you with exceptional heart care, we have created designated Provider Care Teams.  These Care Teams include your primary Cardiologist (physician) and Advanced Practice Providers (APPs -  Physician Assistants and Nurse Practitioners) who all work together to provide you with the care you need, when you need it.  We recommend signing up for the patient portal called "MyChart".  Sign up information is provided on this After Visit Summary.  MyChart is used to connect with patients for Virtual Visits (Telemedicine).  Patients are able to view lab/test results, encounter notes, upcoming appointments, etc.  Non-urgent messages can be sent to your provider as well.   To learn more about what you can do with MyChart, go to  [email or web address].    Your next appointment:   As needed  The format for your next appointment:   In Person  Provider:   You may see Dr. Daneen Schick or one of the following Advanced Practice Providers on your designated Care Team:    Cecilie Kicks, NP  Kathyrn Drown, NP    Other Instructions  Dr. Tamala Julian would like for you to monitor your blood pressure daily for a week and call or send a MyChart message with those readings.   Your cardiac CT will be scheduled at one of the below locations:   [facility] [address] [place], [place] [address] [phone]  [place] [address] [place], [place] [address] [phone]  If scheduled at [facility], please arrive at the [facility] main entrance of [facility] 30 minutes prior to test start time. Proceed to the [facility] Department (first floor) to check-in and test prep.  If scheduled at [facility], please arrive 15 mins early for check-in and test prep.  Please follow these instructions carefully (unless otherwise directed):  Hold all erectile dysfunction medications at least 3 days (72 hrs) prior to test.  On the Night Before the Test: . Be sure to Drink plenty of water. . Do not consume any caffeinated/decaffeinated beverages or chocolate 12 hours prior to your test. . Do not take any antihistamines 12 hours prior to  your test.  On the Day of the Test: . Drink plenty of water. Do not drink any water within one hour of the test. . Do not eat any food 4 hours prior to the test. . You may take your regular medications prior to the test.  . Take metoprolol (Lopressor) two hours prior to test. . HOLD Furosemide/Hydrochlorothiazide morning of the test.       After the Test: . Drink plenty of water. . After receiving IV contrast, you may experience a mild flushed feeling. This is  normal. . On occasion, you may experience a mild rash up to 24 hours after the test. This is not dangerous. If this occurs, you can take Benadryl 25 mg and increase your fluid intake. . If you experience trouble breathing, this can be serious. If it is severe call 911 IMMEDIATELY. If it is mild, please call our office. . If you take any of these medications: Glipizide/Metformin, Avandament, Glucavance, please do not take 48 hours after completing test unless otherwise instructed.   Once we have confirmed authorization from your insurance company, we will call you to set up a date and time for your test. Based on how quickly your insurance processes prior authorizations requests, please allow up to 4 weeks to be contacted for scheduling your Cardiac CT appointment. Be advised that routine Cardiac CT appointments could be scheduled as many as 8 weeks after your provider has ordered it.  For non-scheduling related questions, please contact the cardiac imaging nurse navigator should you have any questions/concerns: Marchia Bond, Cardiac Imaging Nurse Navigator Burley Saver, Interim Cardiac Imaging Nurse [place] and Vascular Services Direct Office Dial: [phone]   For scheduling needs, including cancellations and rescheduling, please call [place], [phone].       Signed, Sinclair Grooms, MD  08/07/2020 1:04 PM    [facility]

## 2020-08-07 ENCOUNTER — Ambulatory Visit (INDEPENDENT_AMBULATORY_CARE_PROVIDER_SITE_OTHER): Payer: Medicare Other | Admitting: Interventional Cardiology

## 2020-08-07 ENCOUNTER — Other Ambulatory Visit: Payer: Self-pay

## 2020-08-07 ENCOUNTER — Encounter: Payer: Self-pay | Admitting: Interventional Cardiology

## 2020-08-07 VITALS — BP 162/68 | HR 57 | Ht 70.0 in | Wt 193.0 lb

## 2020-08-07 DIAGNOSIS — I2510 Atherosclerotic heart disease of native coronary artery without angina pectoris: Secondary | ICD-10-CM

## 2020-08-07 DIAGNOSIS — I739 Peripheral vascular disease, unspecified: Secondary | ICD-10-CM

## 2020-08-07 DIAGNOSIS — I10 Essential (primary) hypertension: Secondary | ICD-10-CM

## 2020-08-07 DIAGNOSIS — R072 Precordial pain: Secondary | ICD-10-CM

## 2020-08-07 DIAGNOSIS — Z7189 Other specified counseling: Secondary | ICD-10-CM | POA: Diagnosis not present

## 2020-08-07 DIAGNOSIS — R0789 Other chest pain: Secondary | ICD-10-CM | POA: Diagnosis not present

## 2020-08-07 DIAGNOSIS — R0609 Other forms of dyspnea: Secondary | ICD-10-CM

## 2020-08-07 DIAGNOSIS — R0600 Dyspnea, unspecified: Secondary | ICD-10-CM

## 2020-08-07 DIAGNOSIS — E785 Hyperlipidemia, unspecified: Secondary | ICD-10-CM | POA: Diagnosis not present

## 2020-08-07 MED ORDER — METOPROLOL TARTRATE 50 MG PO TABS: ORAL_TABLET | ORAL | 0 refills | Status: DC

## 2020-08-07 NOTE — Patient Instructions (Addendum)
Medication Instructions:  Your physician recommends that you continue on your current medications as directed. Please refer to the Current Medication list given to you today.  *If you need a refill on your cardiac medications before your next appointment, please call your pharmacy*   Lab Work: BMET If you have labs (blood work) drawn today and your tests are completely normal, you will receive your results only by: Marland Kitchen MyChart Message (if you have MyChart) OR . A paper copy in the mail If you have any lab test that is abnormal or we need to change your treatment, we will call you to review the results.   Testing/Procedures: Your physician recommends that you have a Coronary CT performed.   Follow-Up: At [facility], you and your health needs are our priority.  As part of our continuing mission to provide you with exceptional heart care, we have created designated Provider Care Teams.  These Care Teams include your primary Cardiologist (physician) and Advanced Practice Providers (APPs -  Physician Assistants and Nurse Practitioners) who all work together to provide you with the care you need, when you need it.  We recommend signing up for the patient portal called "MyChart".  Sign up information is provided on this After Visit Summary.  MyChart is used to connect with patients for Virtual Visits (Telemedicine).  Patients are able to view lab/test results, encounter notes, upcoming appointments, etc.  Non-urgent messages can be sent to your provider as well.   To learn more about what you can do with MyChart, go to [email or web address].    Your next appointment:   As needed  The format for your next appointment:   In Person  Provider:   You may see Dr. Daneen Schick or one of the following Advanced Practice Providers on your designated Care Team:    Cecilie Kicks, NP  Kathyrn Drown, NP    Other Instructions  Dr. Tamala Julian would like for you to monitor your blood pressure daily for  a week and call or send a MyChart message with those readings.   Your cardiac CT will be scheduled at one of the below locations:   [facility] [address] [place], [place] [address] [phone]  [place] [address] [place], [place] [address] [phone]  If scheduled at [facility], please arrive at the [facility] main entrance of [facility] 30 minutes prior to test start time. Proceed to the [facility] Radiology Department (first floor) to check-in and test prep.  If scheduled at [facility], please arrive 15 mins early for check-in and test prep.  Please follow these instructions carefully (unless otherwise directed):  Hold all erectile dysfunction medications at least 3 days (72 hrs) prior to test.  On the Night Before the Test: . Be sure to Drink plenty of water. . Do not consume any caffeinated/decaffeinated beverages or chocolate 12 hours prior to your test. . Do not take any antihistamines 12 hours prior to your test.  On the Day of the Test: . Drink plenty of water. Do not drink any water within one hour of the test. . Do not eat any food 4 hours prior to the test. . You may take your regular medications prior to the test.  . Take metoprolol (Lopressor) two hours prior to test. . HOLD Furosemide/Hydrochlorothiazide morning of the test.       After the Test: . Drink plenty of water. Marland Kitchen  After receiving IV contrast, you may experience a mild flushed feeling. This is normal. . On occasion, you may experience a mild rash up to 24 hours after the test. This is not dangerous. If this occurs, you can take Benadryl 25 mg and increase your fluid intake. . If you experience trouble breathing, this can be serious. If it is severe call 911 IMMEDIATELY. If it is mild, please call our office. . If you take any of these medications: Glipizide/Metformin, Avandament,  Glucavance, please do not take 48 hours after completing test unless otherwise instructed.   Once we have confirmed authorization from your insurance company, we will call you to set up a date and time for your test. Based on how quickly your insurance processes prior authorizations requests, please allow up to 4 weeks to be contacted for scheduling your Cardiac CT appointment. Be advised that routine Cardiac CT appointments could be scheduled as many as 8 weeks after your provider has ordered it.  For non-scheduling related questions, please contact the cardiac imaging nurse navigator should you have any questions/concerns: Marchia Bond, Cardiac Imaging Nurse Navigator Burley Saver, Interim Cardiac Imaging Nurse [place] and Vascular Services Direct Office Dial: [phone]   For scheduling needs, including cancellations and rescheduling, please call [place], [phone].

## 2020-08-08 LAB — BASIC METABOLIC PANEL
BUN/Creatinine Ratio: 13 (ref 10–24)
BUN: 16 mg/dL (ref 8–27)
CO2: 25 mmol/L (ref 20–29)
Calcium: 9.2 mg/dL (ref 8.6–10.2)
Chloride: 105 mmol/L (ref 96–106)
Creatinine, Ser: 1.19 mg/dL (ref 0.76–1.27)
GFR calc Af Amer: 69 mL/min/{1.73_m2} (ref >59)
GFR calc non Af Amer: 60 mL/min/{1.73_m2} (ref >59)
Glucose: 92 mg/dL (ref 65–99)
Potassium: 4 mmol/L (ref 3.5–5.2)
Sodium: 143 mmol/L (ref 134–144)

## 2020-08-14 ENCOUNTER — Telehealth (HOSPITAL_COMMUNITY): Payer: Self-pay | Admitting: *Deleted

## 2020-08-14 NOTE — Telephone Encounter (Signed)
Reaching out to patient to offer assistance regarding upcoming cardiac imaging study; pt's wife verbalizes understanding of appt date/time, parking situation and where to check in, pre-test NPO status and medications ordered, and verified current allergies; name and call back number provided for further questions should they arise  Gordy Clement RN Navigator Cardiac Imaging [place] and Vascular [phone] office [phone] cell

## 2020-08-17 ENCOUNTER — Ambulatory Visit (HOSPITAL_COMMUNITY)
Admission: RE | Admit: 2020-08-17 | Discharge: 2020-08-17 | Disposition: A | Discharge status: Home / Self Care | Payer: Medicare Other | Source: Ambulatory Visit | Attending: Interventional Cardiology | Admitting: Interventional Cardiology

## 2020-08-17 ENCOUNTER — Other Ambulatory Visit: Payer: Self-pay

## 2020-08-17 DIAGNOSIS — R072 Precordial pain: Secondary | ICD-10-CM

## 2020-08-17 DIAGNOSIS — R0600 Dyspnea, unspecified: Secondary | ICD-10-CM | POA: Insufficient documentation

## 2020-08-17 DIAGNOSIS — I2510 Atherosclerotic heart disease of native coronary artery without angina pectoris: Secondary | ICD-10-CM | POA: Diagnosis not present

## 2020-08-17 DIAGNOSIS — R0609 Other forms of dyspnea: Secondary | ICD-10-CM

## 2020-08-17 MED ORDER — NITROGLYCERIN 0.4 MG SL SUBL
0.8000 mg | SUBLINGUAL_TABLET | Freq: Once | SUBLINGUAL | Status: AC
  Administered 2020-08-17: 0.8 mg via SUBLINGUAL

## 2020-08-17 MED ORDER — NITROGLYCERIN 0.4 MG SL SUBL
SUBLINGUAL_TABLET | SUBLINGUAL | Status: AC
  Filled 2020-08-17: qty 2

## 2020-08-17 MED ORDER — IOHEXOL 350 MG/ML SOLN
80.0000 mL | Freq: Once | INTRAVENOUS | Status: AC | PRN
  Administered 2020-08-17: 80 mL via INTRAVENOUS

## 2020-08-18 ENCOUNTER — Ambulatory Visit: Payer: Medicare Other | Admitting: Dermatology

## 2020-08-18 ENCOUNTER — Ambulatory Visit (HOSPITAL_COMMUNITY)
Admission: RE | Admit: 2020-08-18 | Discharge: 2020-08-18 | Disposition: A | Discharge status: Home / Self Care | Payer: Medicare Other | Source: Ambulatory Visit | Attending: Interventional Cardiology | Admitting: Interventional Cardiology

## 2020-08-18 DIAGNOSIS — I2510 Atherosclerotic heart disease of native coronary artery without angina pectoris: Secondary | ICD-10-CM | POA: Insufficient documentation

## 2020-08-18 DIAGNOSIS — R079 Chest pain, unspecified: Secondary | ICD-10-CM | POA: Diagnosis not present

## 2020-08-19 ENCOUNTER — Telehealth: Payer: Self-pay | Admitting: Interventional Cardiology

## 2020-08-19 ENCOUNTER — Other Ambulatory Visit: Payer: Self-pay | Admitting: *Deleted

## 2020-08-19 MED ORDER — ROSUVASTATIN CALCIUM 20 MG PO TABS: 20.0000 mg | ORAL_TABLET | Freq: Every day | ORAL | 3 refills | Status: DC

## 2020-08-19 MED ORDER — METOPROLOL SUCCINATE ER 25 MG PO TB24: 25.0000 mg | ORAL_TABLET | Freq: Every day | ORAL | 3 refills | Status: DC

## 2020-08-19 NOTE — Telephone Encounter (Signed)
Patient's wife calling for the patient's CT results. She states they are very anxious.

## 2020-08-19 NOTE — Telephone Encounter (Signed)
Spoke with pt and obtained permission to speak with wife.  Reviewed Coronary CT results and recommendations per Dr. Tamala Julian.  Scheduled pt to come in to see Dr. Tamala Julian on 2/1.  Wife verbalized understanding and was in agreement with this plan.

## 2020-08-24 NOTE — Progress Notes (Signed)
Cardiology Office Note:    Date:  08/25/2020   ID:  Paul Mann, DOB Feb 06, 1946, MRN [id number]  PCP:  Wenda Low, MD  Cardiologist:  Sinclair Grooms, MD   Referring MD: Wenda Low, MD   Chief Complaint  Patient presents with  . Coronary Artery Disease  . Hyperlipidemia  . Hypertension    History of Present Illness:    Paul Mann is a 75 y.o. male with a hx of Non-obstructive CAD by cor  angiography 2002 (40% mid LAD and mid RCA), with recent DOE and chest tightness. CRT CTA with decreased LAD FFR 08/17/2020.  He was seen several weeks ago.  After assessment of risk and the identification of nonobstructive coronary disease 20 years ago on coronary angiography, CT angiogram with FFR was performed to define coronary anatomy and help guide therapy.  The patient did had 1-2 episodes of chest pressure with aggressive physical activity.  He is almost always below this level of activity due to chronic low back discomfort.  He has not had discomfort at rest.  He denies palpitations.  After seeing the results of his CT FFR metoprolol succinate 25 mg/day and rosuvastatin 20 mg/day were started.  He had been previously on lovastatin which was discontinued.  He took each of these medications no more than 2 days and discontinued both because of unacceptable side effects.  He felt he was having GI side effects with rosuvastatin.  There was also some aching.  The metoprolol he stopped taking because of GI side effects and feeling "horrible".  He is here today to review the results of his CT angiogram and for reassessment of symptoms.  In the interval since last being seen in the office he has not had any significant chest discomfort at all.  His major concern is been his lower back and sciatica.  Past Medical History:  Diagnosis Date  . Actinic keratosis   . Arthritis   . Basal cell carcinoma 11/03/2008   Left med. clavicle  . Basal cell carcinoma 09/28/2009   Left anterior neck  .  Basal cell carcinoma 09/28/2009   Left medial clavicle. Excised: 01/18/2010  . Basal cell carcinoma 01/18/2010   Right post. top of shoulder  . Basal cell carcinoma 01/18/2010   Left med. clavicle. Excised: 03/03/2010  . Basal cell carcinoma 07/09/2013   Left postauricular  . Basal cell carcinoma 07/09/2013   left lateral neck  . Basal cell carcinoma of face 07/02/2014   Nodular pattern. Left temple  . Basal cell carcinoma of face 05/17/2017   Nodular pattern. Right Zygoma. Tx: EDC  . Basal cell carcinoma of trunk 01/18/2010   Superficial. Left mid to upper back, ~5.0cm lat. to spine.  . Basal cell carcinoma of trunk 01/18/2010   Right mid to upper back, 4.0cm lat. to spine  . Basal cell carcinoma of trunk 03/04/2015   Superficial. RIght low back lateral.  . Basal cell carcinoma of trunk 02/10/2016   Superficial. Right upper back  . Basal cell carcinoma of trunk 02/10/2016   Superficial. Left upper back.  . 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 pretibial just below knee  . Cancer [facility]    akin cancer areas removed  . Coronary artery disease   . Diverticulitis   . DJD (degenerative joint disease)    bulging disc upper and lower back  . Elevated cholesterol   . GERD (gastroesophageal reflux disease)   . Hypertension   . Squamous  cell carcinoma of face 09/28/2009   SCCis. Left forehead.  . Squamous cell carcinoma of skin 11/03/2008   Well differentiated SCC. Left cheek/mid mandible. Excised: 12/10/2008  . Squamous cell carcinoma of skin 10/21/2019   right lat neck/infra auricular  . Squamous cell carcinoma of trunk 07/04/2018   Well differentiated SCC with superficial infiltration. Left suprasternal notch. Tx: EDC.    Past Surgical History:  Procedure Laterality Date  . COLONOSCOPY WITH PROPOFOL N/A 02/22/2016   Procedure: COLONOSCOPY WITH PROPOFOL;  Surgeon: Garlan Fair, MD;  Location: [facility];  Service: Endoscopy;  Laterality: N/A;  . moes  procedure for skin cancer 2016    . skin cancer area removed  02/10/2016   upper back and and face  . tumor rmoved from right hand  1980   right hand  . wrist surgery Right     Current Medications: Current Meds  Medication Sig  . amLODipine-benazepril (LOTREL) 5-20 MG per capsule Take 1 capsule by mouth daily.   Marland Kitchen aspirin EC 81 MG tablet Take 81 mg by mouth daily.   . cetirizine (ZYRTEC) 10 MG tablet Take 10 mg by mouth daily as needed for allergies.  . clonazePAM (KLONOPIN) 0.5 MG tablet Take 0.5 mg by mouth daily as needed.  . docusate sodium (COLACE) 100 MG capsule Take 100 mg by mouth as needed.  Marland Kitchen esomeprazole (NEXIUM) 40 MG capsule Take 40 mg by mouth daily at 12 noon.  . fluorouracil (EFUDEX) 5 % cream Apply thin coat to Face and ears once daily in the evening for 3 weeks, To start on October 1st. Wash off in morning  . fluticasone (FLONASE) 50 MCG/ACT nasal spray Place 1 spray into both nostrils daily.   Marland Kitchen MAGNESIUM PO Take by mouth.  . Omega-3 Fatty Acids (FISH OIL) 1200 MG CPDR Take 1,200 mg by mouth daily.  Marland Kitchen POTASSIUM GLUCONATE PO Take 1 tablet by mouth as needed.  Derrill Memo ON 08/26/2020] rosuvastatin (CRESTOR) 5 MG tablet Take 1 tablet (5 mg total) by mouth every Monday, Wednesday, and Friday.     Allergies:   Ciprofloxacin, Clarithromycin, Codeine, Penicillins, Uroxatral [alfuzosin], and Zocor [simvastatin]   Social History   Socioeconomic History  . Marital status: Married    Spouse name: Not on file  . Number of children: Not on file  . Years of education: Not on file  . Highest education level: Not on file  Occupational History  . Not on file  Tobacco Use  . Smoking status: Never Smoker  . Smokeless tobacco: Never Used  Substance and Sexual Activity  . Alcohol use: No  . Drug use: No  . Sexual activity: Not on file  Other Topics Concern  . Not on file  Social History Narrative  . Not on file   Social Determinants of Health   Financial Resource Strain:  Not on file  Food Insecurity: Not on file  Transportation Needs: Not on file  Physical Activity: Not on file  Stress: Not on file  Social Connections: Not on file     Family History: The patient's family history includes Heart attack in his father; Heart disease in his father and mother.  ROS:   Please see the history of present illness.    Chronic back pain.  Medication intolerance.  All other systems reviewed and are negative.  EKGs/Labs/Other Studies Reviewed:    The following studies were reviewed today:  COR CTA 08/17/2020 IMPRESSION: 1. Coronary calcium score of 1283. This was [employer]  percentile for age and sex matched control.  2. Normal coronary origin with right dominance.  3. There is diffuse, calcified plaque. There is severe (>70%) stenosis in the proximal and mid LAD and ostial LCX. There is moderate (50-69%) RCA plaque. Each region is heavily calcified with can lead to blooming artifact and over-estimation of disease severity. Will send for FFRct.  COR CT FFR 08/18/2020: IMPRESSION: 1. FFRct findings are consistent with severe stenosis in the mid to distal LAD.  2.  FFRct findings in the mid LCX are indeterminate.  3. FFRct findings in the RCA are consistent with non-obstructive disease.  4. Consider aggressive medical management vs. Consideration for cardiac catheterization.  EKG:  EKG not repeated  Recent Labs: 08/07/2020: BUN 16; Creatinine, Ser 1.19; Potassium 4.0; Sodium 143  Recent Lipid Panel No results found for: CHOL, TRIG, HDL, CHOLHDL, VLDL, LDLCALC, LDLDIRECT  Physical Exam:    VS:  BP 138/72   Pulse (!) 59   Ht 5\' 10"  (1.778 m)   Wt 186 lb 3.2 oz (84.5 kg)   SpO2 98%   BMI 26.72 kg/m     Wt Readings from Last 3 Encounters:  08/25/20 186 lb 3.2 oz (84.5 kg)  08/07/20 193 lb (87.5 kg)  04/18/16 184 lb (83.5 kg)     GEN: Overweight. No acute distress HEENT: Normal NECK: No JVD. LYMPHATICS: No lymphadenopathy CARDIAC: No  murmur. RRR no gallop, or edema. VASCULAR:  Normal Pulses. No bruits. RESPIRATORY:  Clear to auscultation without rales, wheezing or rhonchi  ABDOMEN: Soft, non-tender, non-distended, No pulsatile mass, MUSCULOSKELETAL: No deformity  SKIN: Warm and dry NEUROLOGIC:  Alert and oriented x 3 PSYCHIATRIC:  Normal affect   ASSESSMENT:    1. Chest tightness   2. CAD in native artery   3. DOE (dyspnea on exertion)   4. Primary hypertension   5. Hyperlipidemia with target LDL less than 70   6. Educated about COVID-19 virus infection    PLAN:    In order of problems listed above:  1. Related to coronary artery disease.  CT angiogram with FFR demonstrates significant mid LAD stenosis with FFR in the distal LAD in the 0.7 range.  This was reviewed with the patient and the wife.  Risk of having an acute cardiac event was described as being in the 2% to 3 %/year range which would be a 10-year risk of 20 to 30% with less than 1 %/year being baseline for his age group.  Today we have decided to resume lower intensity rosuvastatin 5 mg Monday Wednesday and Friday, start aspirin 81 mg/day, use sublingual nitroglycerin if chest pressure is precipitated by activity and does not resolve within 5 minutes or if there is spontaneous discomfort that lasts for greater than 5 minutes.  He was instructed on use. 2. CAD is as noted.  Symptoms are as noted. 3. Dyspnea on exertion could be ischemia related.  Encouraged aerobic activity as tolerated does not produce excessive symptoms or chest pain. 4. Blood pressure target less than 130/80 mmHg. 5. LDL target less than 70.  He will be seen in the lipid panel was obtained in the next 4 weeks.  Further medication adjustments will then be necessary.  He feels that he will not be able to tolerate high intensity statin therapy.  May be a candidate for bempedoic acid or PCSK9. 6. Vaccinated and practicing mitigation.  Plan to see the patient in 4 to 6 weeks.  Earlier if  chest discomfort.  Overall  education and awareness concerning secondary risk prevention was discussed in detail: LDL less than 70, hemoglobin A1c less than 7, blood pressure target less than 130/80 mmHg, >150 minutes of moderate aerobic activity per week, avoidance of smoking, weight control (via diet and exercise), and continued surveillance/management of/for obstructive sleep apnea.    Medication Adjustments/Labs and Tests Ordered: Current medicines are reviewed at length with the patient today.  Concerns regarding medicines are outlined above.  Orders Placed This Encounter  Procedures  . Lipid panel  . Hepatic function panel  . AMB Referral to [facility]  . EKG 12-Lead   Meds ordered this encounter  Medications  . rosuvastatin (CRESTOR) 5 MG tablet    Sig: Take 1 tablet (5 mg total) by mouth every Monday, Wednesday, and Friday.    Dispense:  45 tablet    Refill:  3    Dose change    Patient Instructions  Medication Instructions:  1) CHANGE Rosuvastatin to 5mg  once daily on Monday, Wednesday and Friday.  *If you need a refill on your cardiac medications before your next appointment, please call your pharmacy*   Lab Work: Lipid and Liver in 1 month.  You will need to be fasting for these labs (nothing to eat or drink after midnight except water and black coffee).  If you have labs (blood work) drawn today and your tests are completely normal, you will receive your results only by: Marland Kitchen MyChart Message (if you have MyChart) OR . A paper copy in the mail If you have any lab test that is abnormal or we need to change your treatment, we will call you to review the results.   Testing/Procedures: None   Follow-Up:  Your physician recommends that you schedule a follow-up appointment in: 5-6 weeks with our [facility].   At [facility], you and your health needs are our priority.  As part of our continuing mission to provide you with exceptional heart care, we have  created designated Provider Care Teams.  These Care Teams include your primary Cardiologist (physician) and Advanced Practice Providers (APPs -  Physician Assistants and Nurse Practitioners) who all work together to provide you with the care you need, when you need it.  We recommend signing up for the patient portal called "MyChart".  Sign up information is provided on this After Visit Summary.  MyChart is used to connect with patients for Virtual Visits (Telemedicine).  Patients are able to view lab/test results, encounter notes, upcoming appointments, etc.  Non-urgent messages can be sent to your provider as well.   To learn more about what you can do with MyChart, go to [email or web address].    Your next appointment:   2-3 month(s)  The format for your next appointment:   In Person  Provider:   You may see Sinclair Grooms, MD or one of the following Advanced Practice Providers on your designated Care Team:    Kathyrn Drown, NP    Other Instructions      Signed, Sinclair Grooms, MD  08/25/2020 10:05 PM    [place]

## 2020-08-25 ENCOUNTER — Ambulatory Visit (INDEPENDENT_AMBULATORY_CARE_PROVIDER_SITE_OTHER): Payer: Medicare Other | Admitting: Interventional Cardiology

## 2020-08-25 ENCOUNTER — Other Ambulatory Visit: Payer: Self-pay

## 2020-08-25 ENCOUNTER — Encounter: Payer: Self-pay | Admitting: Interventional Cardiology

## 2020-08-25 VITALS — BP 138/72 | HR 59 | Ht 70.0 in | Wt 186.2 lb

## 2020-08-25 DIAGNOSIS — Z7189 Other specified counseling: Secondary | ICD-10-CM | POA: Diagnosis not present

## 2020-08-25 DIAGNOSIS — R0600 Dyspnea, unspecified: Secondary | ICD-10-CM | POA: Diagnosis not present

## 2020-08-25 DIAGNOSIS — R0609 Other forms of dyspnea: Secondary | ICD-10-CM

## 2020-08-25 DIAGNOSIS — R0789 Other chest pain: Secondary | ICD-10-CM

## 2020-08-25 DIAGNOSIS — I10 Essential (primary) hypertension: Secondary | ICD-10-CM | POA: Diagnosis not present

## 2020-08-25 DIAGNOSIS — I2510 Atherosclerotic heart disease of native coronary artery without angina pectoris: Secondary | ICD-10-CM

## 2020-08-25 DIAGNOSIS — E785 Hyperlipidemia, unspecified: Secondary | ICD-10-CM | POA: Diagnosis not present

## 2020-08-25 MED ORDER — ROSUVASTATIN CALCIUM 5 MG PO TABS: 5.0000 mg | ORAL_TABLET | ORAL | 3 refills | Status: DC

## 2020-08-25 NOTE — Patient Instructions (Signed)
Medication Instructions:  1) CHANGE Rosuvastatin to 5mg  once daily on Monday, Wednesday and Friday.  *If you need a refill on your cardiac medications before your next appointment, please call your pharmacy*   Lab Work: Lipid and Liver in 1 month.  You will need to be fasting for these labs (nothing to eat or drink after midnight except water and black coffee).  If you have labs (blood work) drawn today and your tests are completely normal, you will receive your results only by: Marland Kitchen MyChart Message (if you have MyChart) OR . A paper copy in the mail If you have any lab test that is abnormal or we need to change your treatment, we will call you to review the results.   Testing/Procedures: None   Follow-Up:  Your physician recommends that you schedule a follow-up appointment in: 5-6 weeks with our [facility].   At [facility], you and your health needs are our priority.  As part of our continuing mission to provide you with exceptional heart care, we have created designated Provider Care Teams.  These Care Teams include your primary Cardiologist (physician) and Advanced Practice Providers (APPs -  Physician Assistants and Nurse Practitioners) who all work together to provide you with the care you need, when you need it.  We recommend signing up for the patient portal called "MyChart".  Sign up information is provided on this After Visit Summary.  MyChart is used to connect with patients for Virtual Visits (Telemedicine).  Patients are able to view lab/test results, encounter notes, upcoming appointments, etc.  Non-urgent messages can be sent to your provider as well.   To learn more about what you can do with MyChart, go to [email or web address].    Your next appointment:   2-3 month(s)  The format for your next appointment:   In Person  Provider:   You may see Sinclair Grooms, MD or one of the following Advanced Practice Providers on your designated Care Team:    Kathyrn Drown, NP    Other Instructions

## 2020-09-28 ENCOUNTER — Other Ambulatory Visit: Payer: Self-pay

## 2020-09-28 ENCOUNTER — Other Ambulatory Visit: Payer: Medicare Other | Admitting: *Deleted

## 2020-09-28 DIAGNOSIS — I2510 Atherosclerotic heart disease of native coronary artery without angina pectoris: Secondary | ICD-10-CM

## 2020-09-28 DIAGNOSIS — E785 Hyperlipidemia, unspecified: Secondary | ICD-10-CM

## 2020-09-28 LAB — HEPATIC FUNCTION PANEL
ALT: 14 IU/L (ref 0–44)
AST: 17 IU/L (ref 0–40)
Albumin: 4.5 g/dL (ref 3.7–4.7)
Alkaline Phosphatase: 77 IU/L (ref 44–121)
Bilirubin Total: 0.5 mg/dL (ref 0.0–1.2)
Bilirubin, Direct: 0.14 mg/dL (ref 0.00–0.40)
Total Protein: 6.9 g/dL (ref 6.0–8.5)

## 2020-09-28 LAB — LIPID PANEL
Chol/HDL Ratio: 3.4 ratio (ref 0.0–5.0)
Cholesterol, Total: 155 mg/dL (ref 100–199)
HDL: 45 mg/dL (ref >39)
LDL Chol Calc (NIH): 91 mg/dL (ref 0–99)
Triglycerides: 106 mg/dL (ref 0–149)
VLDL Cholesterol Cal: 19 mg/dL (ref 5–40)

## 2020-10-05 ENCOUNTER — Ambulatory Visit (INDEPENDENT_AMBULATORY_CARE_PROVIDER_SITE_OTHER): Payer: Medicare Other | Admitting: Pharmacist

## 2020-10-05 ENCOUNTER — Other Ambulatory Visit: Payer: Self-pay

## 2020-10-05 DIAGNOSIS — E785 Hyperlipidemia, unspecified: Secondary | ICD-10-CM

## 2020-10-05 DIAGNOSIS — I2510 Atherosclerotic heart disease of native coronary artery without angina pectoris: Secondary | ICD-10-CM

## 2020-10-05 MED ORDER — EZETIMIBE 10 MG PO TABS: 5.0000 mg | ORAL_TABLET | Freq: Every day | ORAL | 11 refills | Status: DC

## 2020-10-05 NOTE — Patient Instructions (Signed)
Start taking ezetimibe (Zetia) 5mg  every day.  Take with food to minimize the chance of an upset stomach.   Continue rosuvastatin (Crestor) 5mg  three times a week.  Continue to minimize sugary and fatty foods, and increase the amount of walking per week, as your back allows.  Please call our office if you have any questions: [phone]

## 2020-10-05 NOTE — Progress Notes (Addendum)
Patient ID: [name]                 DOB: 1945/07/28                    MRN: [id number]     HPI: Paul Mann is a 75 y.o. male patient referred to lipid clinic by Daneen Schick, MD. [facility] is significant for Non-obstructive CAD by cor angiography 2002 (40% mid LAD and mid RCA),and current diffuse, calcified plaque. There is severe (>70%) stenosis in the proximal and mid LAD and ostial LCX. There is moderate (50-69%) RCA plaque  Patient presented to lipid clinic, accompanied by his wife. There is some uncertainty in recent notes about when he was on the 20mg  dose of rosuvastatin, but per patient and wife, both rosuvastatin and simvastatin were discontinued historically due to intolerable joint pain. He did not report any stomach upset when taking these medications.  Current Medications:  Aspirin 81 daily Rosuvastatin 5 mg Monday Wednesday and Friday  Intolerances:  Rosuvastatin 20 mg/day due to joint pain Simvastatin d/t joint pain Lovastatin (not effective)  Risk Factors: dyslipidemia, hypertension and family history LDL goal: <70  Diet:  Breakfast: oatmeal with two packs of brown sugar Dinner: chicken, grilled salmon from a restaurant, pinto beans, coleslaw and cabbage  Dessert: has tea/lemonade or a milkshake every other month  Exercise: Walking 1/2 mile every other day, working at golf course. Limited by a back injury.  Family History: Heart attack in his father; Heart disease in his father and mother.  Social History: never smoked, does not drink  Labs: Lipid Panel     Component Value Date/Time   CHOL 155 09/28/2020 0736   TRIG 106 09/28/2020 0736   HDL 45 09/28/2020 0736   CHOLHDL 3.4 09/28/2020 0736   LDLCALC 91 09/28/2020 0736   LABVLDL 19 09/28/2020 0736     Past Medical History:  Diagnosis Date  . Actinic keratosis   . Arthritis   . Basal cell carcinoma 11/03/2008   Left med. clavicle  . Basal cell carcinoma 09/28/2009   Left anterior neck  . Basal cell  carcinoma 09/28/2009   Left medial clavicle. Excised: 01/18/2010  . Basal cell carcinoma 01/18/2010   Right post. top of shoulder  . Basal cell carcinoma 01/18/2010   Left med. clavicle. Excised: 03/03/2010  . Basal cell carcinoma 07/09/2013   Left postauricular  . Basal cell carcinoma 07/09/2013   left lateral neck  . Basal cell carcinoma of face 07/02/2014   Nodular pattern. Left temple  . Basal cell carcinoma of face 05/17/2017   Nodular pattern. Right Zygoma. Tx: EDC  . Basal cell carcinoma of trunk 01/18/2010   Superficial. Left mid to upper back, ~5.0cm lat. to spine.  . Basal cell carcinoma of trunk 01/18/2010   Right mid to upper back, 4.0cm lat. to spine  . Basal cell carcinoma of trunk 03/04/2015   Superficial. RIght low back lateral.  . Basal cell carcinoma of trunk 02/10/2016   Superficial. Right upper back  . Basal cell carcinoma of trunk 02/10/2016   Superficial. Left upper back.  . 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 pretibial just below knee  . Cancer [facility]    akin cancer areas removed  . Coronary artery disease   . Diverticulitis   . DJD (degenerative joint disease)    bulging disc upper and lower back  . Elevated cholesterol   . GERD (gastroesophageal reflux disease)   .  Hypertension   . Squamous cell carcinoma of face 09/28/2009   SCCis. Left forehead.  . Squamous cell carcinoma of skin 11/03/2008   Well differentiated SCC. Left cheek/mid mandible. Excised: 12/10/2008  . Squamous cell carcinoma of skin 10/21/2019   right lat neck/infra auricular  . Squamous cell carcinoma of trunk 07/04/2018   Well differentiated SCC with superficial infiltration. Left suprasternal notch. Tx: EDC.    Current Outpatient Medications on File Prior to Visit  Medication Sig Dispense Refill  . amLODipine-benazepril (LOTREL) 5-20 MG per capsule Take 1 capsule by mouth daily.     Marland Kitchen aspirin EC 81 MG tablet Take 81 mg by mouth daily.     . cetirizine (ZYRTEC)  10 MG tablet Take 10 mg by mouth daily as needed for allergies.    . clonazePAM (KLONOPIN) 0.5 MG tablet Take 0.5 mg by mouth daily as needed.    . docusate sodium (COLACE) 100 MG capsule Take 100 mg by mouth as needed.    Marland Kitchen esomeprazole (NEXIUM) 40 MG capsule Take 40 mg by mouth daily at 12 noon.    . fluorouracil (EFUDEX) 5 % cream Apply thin coat to Face and ears once daily in the evening for 3 weeks, To start on October 1st. Wash off in morning 40 g 1  . fluticasone (FLONASE) 50 MCG/ACT nasal spray Place 1 spray into both nostrils daily.     Marland Kitchen MAGNESIUM PO Take by mouth.    . Omega-3 Fatty Acids (FISH OIL) 1200 MG CPDR Take 1,200 mg by mouth daily.    Marland Kitchen POTASSIUM GLUCONATE PO Take 1 tablet by mouth as needed.    . rosuvastatin (CRESTOR) 5 MG tablet Take 1 tablet (5 mg total) by mouth every Monday, Wednesday, and Friday. 45 tablet 3   No current facility-administered medications on file prior to visit.    Allergies  Allergen Reactions  . Ciprofloxacin     Other reaction(s): rash  . Clarithromycin     Other reaction(s): rash  . Codeine Other (See Comments)    Took to much about 20 years ago, made dizzy  . Penicillins Other (See Comments)    Childhood allergy Has patient had a PCN reaction causing immediate rash, facial/tongue/throat swelling, SOB or lightheadedness with hypotension: unknown Has patient had a PCN reaction causing severe rash involving mucus membranes or skin necrosis: unknown Has patient had a PCN reaction that required hospitalization unknown Has patient had a PCN reaction occurring within the last 10 years: unknown If all of the above answers are "NO", then may proceed with Cephalosporin use.   Marland Kitchen Uroxatral [Alfuzosin]     Other reaction(s): dry mouth  . Zocor [Simvastatin] Other (See Comments)    Assessment/Plan:  1. Hyperlipidemia -  Patient's most recent LDL is 91, above goal of <70 on rosuvastatin 5mg  three times per week.  Discussed the possibility of  increasing statin dose, but patient had severe joint pain, limiting ADLs in the past with higher statin doses and does not want to increase the dose at this time. Start ezetimibe 5 mg once daily. Choosing to starting at lower dose due to chance of stomach upset, and study showing comparable efficacy with the higher dose. Continue rosuvastatin 5mg  three times a week as he is tolerating this dose.   We discussed injectable PCSK9i like Repatha as options if this regimen is not tolerated or further LDL lowering is required in the future. Discussed with patient and wife about increasing activity and improving diet where  possible. Patient has a back injury that limits his ability to walk or exercise for long periods, but is adherent to a low-cholesterol, low-sugar diet. Suggest seated exercise such as desk cycle. Follow up labs scheduled for 2-3 months from now.  Thank you,  Norina Buzzard, PharmD PGY1 Pharmacy Resident 10/05/2020 8:19 AM

## 2020-10-21 DIAGNOSIS — H2513 Age-related nuclear cataract, bilateral: Secondary | ICD-10-CM | POA: Diagnosis not present

## 2020-10-25 NOTE — Progress Notes (Deleted)
Cardiology Office Note:    Date:  10/25/2020   ID:  Paul Mann, DOB 12/05/1945, MRN [id number]  PCP:  Wenda Low, MD  Cardiologist:  Sinclair Grooms, MD   Referring MD: Wenda Low, MD   No chief complaint on file.   History of Present Illness:    Paul Mann is a 75 y.o. male with a hx of  non-obstructive CAD by coronary angiography 2002 (40% mid LAD and mid RCA),with recentDOEand chest tightness. COR CTA with decreased LAD FFR 08/17/2020.  ***  Past Medical History:  Diagnosis Date  . Actinic keratosis   . Arthritis   . Basal cell carcinoma 11/03/2008   Left med. clavicle  . Basal cell carcinoma 09/28/2009   Left anterior neck  . Basal cell carcinoma 09/28/2009   Left medial clavicle. Excised: 01/18/2010  . Basal cell carcinoma 01/18/2010   Right post. top of shoulder  . Basal cell carcinoma 01/18/2010   Left med. clavicle. Excised: 03/03/2010  . Basal cell carcinoma 07/09/2013   Left postauricular  . Basal cell carcinoma 07/09/2013   left lateral neck  . Basal cell carcinoma of face 07/02/2014   Nodular pattern. Left temple  . Basal cell carcinoma of face 05/17/2017   Nodular pattern. Right Zygoma. Tx: EDC  . Basal cell carcinoma of trunk 01/18/2010   Superficial. Left mid to upper back, ~5.0cm lat. to spine.  . Basal cell carcinoma of trunk 01/18/2010   Right mid to upper back, 4.0cm lat. to spine  . Basal cell carcinoma of trunk 03/04/2015   Superficial. RIght low back lateral.  . Basal cell carcinoma of trunk 02/10/2016   Superficial. Right upper back  . Basal cell carcinoma of trunk 02/10/2016   Superficial. Left upper back.  . 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 pretibial just below knee  . Cancer [facility])    akin cancer areas removed  . Coronary artery disease   . Diverticulitis   . DJD (degenerative joint disease)    bulging disc upper and lower back  . Elevated cholesterol   . GERD (gastroesophageal reflux disease)    . Hypertension   . Squamous cell carcinoma of face 09/28/2009   SCCis. Left forehead.  . Squamous cell carcinoma of skin 11/03/2008   Well differentiated SCC. Left cheek/mid mandible. Excised: 12/10/2008  . Squamous cell carcinoma of skin 10/21/2019   right lat neck/infra auricular  . Squamous cell carcinoma of trunk 07/04/2018   Well differentiated SCC with superficial infiltration. Left suprasternal notch. Tx: EDC.    Past Surgical History:  Procedure Laterality Date  . COLONOSCOPY WITH PROPOFOL N/A 02/22/2016   Procedure: COLONOSCOPY WITH PROPOFOL;  Surgeon: Garlan Fair, MD;  Location: [facility];  Service: Endoscopy;  Laterality: N/A;  . moes procedure for skin cancer 2016    . skin cancer area removed  02/10/2016   upper back and and face  . tumor rmoved from right hand  1980   right hand  . wrist surgery Right     Current Medications: No outpatient medications have been marked as taking for the 10/26/20 encounter (Appointment) with Belva Crome, MD.     Allergies:   Ciprofloxacin, Clarithromycin, Codeine, Penicillins, Uroxatral [alfuzosin], and Zocor [simvastatin]   Social History   Socioeconomic History  . Marital status: Married    Spouse name: Not on file  . Number of children: Not on file  . Years of education: Not on file  . Highest  education level: Not on file  Occupational History  . Not on file  Tobacco Use  . Smoking status: Never Smoker  . Smokeless tobacco: Never Used  Substance and Sexual Activity  . Alcohol use: No  . Drug use: No  . Sexual activity: Not on file  Other Topics Concern  . Not on file  Social History Narrative  . Not on file   Social Determinants of Health   Financial Resource Strain: Not on file  Food Insecurity: Not on file  Transportation Needs: Not on file  Physical Activity: Not on file  Stress: Not on file  Social Connections: Not on file     Family History: The patient's family history includes Heart attack  in his father; Heart disease in his father and mother.  ROS:   Please see the history of present illness.    *** All other systems reviewed and are negative.  EKGs/Labs/Other Studies Reviewed:    The following studies were reviewed today: COR CTA 08/17/2020: IMPRESSION: 1. Coronary calcium score of 1283. This was 82nd percentile for age and sex matched control.  2. Normal coronary origin with right dominance.  3. There is diffuse, calcified plaque. There is severe (>70%) stenosis in the proximal and mid LAD and ostial LCX. There is moderate (50-69%) RCA plaque. Each region is heavily calcified with can lead to blooming artifact and over-estimation of disease severity. Will send for FFRct.  FFR 08/18/2020: Normal FFR range is >0.80.  1. Left Main: FFRct 0.99  2. LAD: FFRct proximal 0.92, mid 0.81, distal 0.65  3. LCX: FFRct 0.87 proximal, 0.74 mid.  OM1 FFRct 0.79  4. Ramus: Vessel too small for FFRct  5. RCA: FFRct 0.91 proximal, 0.82 mid. RCA is too small for modeling after the mid vessel.  IMPRESSION: 1. FFRct findings are consistent with severe stenosis in the mid to distal LAD.  2.  FFRct findings in the mid LCX are indeterminate.  3. FFRct findings in the RCA are consistent with non-obstructive disease.  4. Consider aggressive medical management vs. Consideration for cardiac catheterization.  EKG:  EKG ***  Recent Labs: 08/07/2020: BUN 16; Creatinine, Ser 1.19; Potassium 4.0; Sodium 143 09/28/2020: ALT 14  Recent Lipid Panel    Component Value Date/Time   CHOL 155 09/28/2020 0736   TRIG 106 09/28/2020 0736   HDL 45 09/28/2020 0736   CHOLHDL 3.4 09/28/2020 0736   LDLCALC 91 09/28/2020 0736    Physical Exam:    VS:  There were no vitals taken for this visit.    Wt Readings from Last 3 Encounters:  08/25/20 186 lb 3.2 oz (84.5 kg)  08/07/20 193 lb (87.5 kg)  04/18/16 184 lb (83.5 kg)     GEN: ***. No acute distress HEENT:  Normal NECK: No JVD. LYMPHATICS: No lymphadenopathy CARDIAC: *** murmur. RRR *** gallop, or edema. VASCULAR: *** Normal Pulses. No bruits. RESPIRATORY:  Clear to auscultation without rales, wheezing or rhonchi  ABDOMEN: Soft, non-tender, non-distended, No pulsatile mass, MUSCULOSKELETAL: No deformity  SKIN: Warm and dry NEUROLOGIC:  Alert and oriented x 3 PSYCHIATRIC:  Normal affect   ASSESSMENT:    1. CAD in native artery   2. Hyperlipidemia with target LDL less than 70   3. Primary hypertension   4. PAD (peripheral artery disease) ([place])   5. Educated about COVID-19 virus infection    PLAN:    In order of problems listed above:  1. Moderate CAD without significant angina. Aspirin, SL NTG, and  report symptoms. *** 2. In lipid clinic and obstructive relative to therapy. Now on low dose Statin and Ezetimibe. 3. ***   Medication Adjustments/Labs and Tests Ordered: Current medicines are reviewed at length with the patient today.  Concerns regarding medicines are outlined above.  No orders of the defined types were placed in this encounter.  No orders of the defined types were placed in this encounter.   There are no Patient Instructions on file for this visit.   Signed, Sinclair Grooms, MD  10/25/2020 2:17 PM    [facility]

## 2020-10-26 ENCOUNTER — Ambulatory Visit: Payer: Medicare Other | Admitting: Interventional Cardiology

## 2020-10-26 DIAGNOSIS — E785 Hyperlipidemia, unspecified: Secondary | ICD-10-CM

## 2020-10-26 DIAGNOSIS — I2510 Atherosclerotic heart disease of native coronary artery without angina pectoris: Secondary | ICD-10-CM

## 2020-10-26 DIAGNOSIS — I10 Essential (primary) hypertension: Secondary | ICD-10-CM

## 2020-10-26 DIAGNOSIS — Z7189 Other specified counseling: Secondary | ICD-10-CM

## 2020-10-26 DIAGNOSIS — I739 Peripheral vascular disease, unspecified: Secondary | ICD-10-CM

## 2020-10-27 ENCOUNTER — Telehealth: Payer: Self-pay | Admitting: Pharmacist

## 2020-10-27 NOTE — Telephone Encounter (Signed)
Patient wife called stating pt had to reschedule his apt yesterday due to stomach bug. Rescheduled in August. Wife wanted to make sure it was ok that patient waited that long to see Dr. Tamala Julian. He is not having chest tightness. Advised I would send message to Dr. Tamala Julian to make sure it was ok, or if they could get him in sooner. Per note, Dr Tamala Julian wanted to see in 4-6 weeks.

## 2020-10-27 NOTE — Telephone Encounter (Signed)
Spoke with wife and moved pt's appt up to this Thursday, 4/7.  Wife appreciative for call.

## 2020-10-28 NOTE — Progress Notes (Signed)
Cardiology Office Note:    Date:  10/29/2020   ID:  Paul Mann, DOB 04/12/1946, MRN [id number]  PCP:  Wenda Low, MD  Cardiologist:  Sinclair Grooms, MD   Referring MD: Wenda Low, MD   Chief Complaint  Patient presents with  . Coronary Artery Disease    History of Present Illness:    Paul Mann is a 75 y.o. male with a hx of non-obstructive CAD by cor angiography 2002 (40% mid LAD and mid RCA),with recentDOEand chest tightness. COR CTA with decreased LAD FFR 08/17/2020.  His last being seen he has begun exercising.  He denies angina.  Physically active.  Has been walking several times per week up to 2 miles without discomfort.  He works at [employer].  He does a lot of walking there.  No symptoms.  He is playing golf frequently.  He is tolerating low-dose statin therapy.  He is in the lipid clinic attempting to prevent progression of disease.  Past Medical History:  Diagnosis Date  . Actinic keratosis   . Arthritis   . Basal cell carcinoma 11/03/2008   Left med. clavicle  . Basal cell carcinoma 09/28/2009   Left anterior neck  . Basal cell carcinoma 09/28/2009   Left medial clavicle. Excised: 01/18/2010  . Basal cell carcinoma 01/18/2010   Right post. top of shoulder  . Basal cell carcinoma 01/18/2010   Left med. clavicle. Excised: 03/03/2010  . Basal cell carcinoma 07/09/2013   Left postauricular  . Basal cell carcinoma 07/09/2013   left lateral neck  . Basal cell carcinoma of face 07/02/2014   Nodular pattern. Left temple  . Basal cell carcinoma of face 05/17/2017   Nodular pattern. Right Zygoma. Tx: EDC  . Basal cell carcinoma of trunk 01/18/2010   Superficial. Left mid to upper back, ~5.0cm lat. to spine.  . Basal cell carcinoma of trunk 01/18/2010   Right mid to upper back, 4.0cm lat. to spine  . Basal cell carcinoma of trunk 03/04/2015   Superficial. RIght low back lateral.  . Basal cell carcinoma of trunk 02/10/2016   Superficial.  Right upper back  . Basal cell carcinoma of trunk 02/10/2016   Superficial. Left upper back.  . 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 pretibial just below knee  . Cancer [facility])    akin cancer areas removed  . Coronary artery disease   . Diverticulitis   . DJD (degenerative joint disease)    bulging disc upper and lower back  . Elevated cholesterol   . GERD (gastroesophageal reflux disease)   . Hypertension   . Squamous cell carcinoma of face 09/28/2009   SCCis. Left forehead.  . Squamous cell carcinoma of skin 11/03/2008   Well differentiated SCC. Left cheek/mid mandible. Excised: 12/10/2008  . Squamous cell carcinoma of skin 10/21/2019   right lat neck/infra auricular  . Squamous cell carcinoma of trunk 07/04/2018   Well differentiated SCC with superficial infiltration. Left suprasternal notch. Tx: EDC.    Past Surgical History:  Procedure Laterality Date  . COLONOSCOPY WITH PROPOFOL N/A 02/22/2016   Procedure: COLONOSCOPY WITH PROPOFOL;  Surgeon: Garlan Fair, MD;  Location: [facility];  Service: Endoscopy;  Laterality: N/A;  . moes procedure for skin cancer 2016    . skin cancer area removed  02/10/2016   upper back and and face  . tumor rmoved from right hand  1980   right hand  . wrist surgery Right  Current Medications: Current Meds  Medication Sig  . amLODipine-benazepril (LOTREL) 5-20 MG per capsule Take 1 capsule by mouth daily.   Marland Kitchen aspirin EC 81 MG tablet Take 81 mg by mouth daily.   . cetirizine (ZYRTEC) 10 MG tablet Take 10 mg by mouth daily as needed for allergies.  . clonazePAM (KLONOPIN) 0.5 MG tablet Take 0.5 mg by mouth daily as needed.  . docusate sodium (COLACE) 100 MG capsule Take 100 mg by mouth as needed.  Marland Kitchen esomeprazole (NEXIUM) 40 MG capsule Take 40 mg by mouth daily at 12 noon.  . ezetimibe (ZETIA) 10 MG tablet Take 0.5 tablets (5 mg total) by mouth daily.  . fluorouracil (EFUDEX) 5 % cream Apply thin coat to Face and ears  once daily in the evening for 3 weeks, To start on October 1st. Wash off in morning  . fluticasone (FLONASE) 50 MCG/ACT nasal spray Place 1 spray into both nostrils daily.   Marland Kitchen MAGNESIUM PO Take by mouth.  . Omega-3 Fatty Acids (FISH OIL) 1200 MG CPDR Take 1,200 mg by mouth daily.  Marland Kitchen POTASSIUM GLUCONATE PO Take 1 tablet by mouth as needed.  . rosuvastatin (CRESTOR) 5 MG tablet Take 1 tablet (5 mg total) by mouth every Monday, Wednesday, and Friday.     Allergies:   Ciprofloxacin, Clarithromycin, Codeine, Penicillins, Uroxatral [alfuzosin], and Zocor [simvastatin]   Social History   Socioeconomic History  . Marital status: Married    Spouse name: Not on file  . Number of children: Not on file  . Years of education: Not on file  . Highest education level: Not on file  Occupational History  . Not on file  Tobacco Use  . Smoking status: Never Smoker  . Smokeless tobacco: Never Used  Substance and Sexual Activity  . Alcohol use: No  . Drug use: No  . Sexual activity: Not on file  Other Topics Concern  . Not on file  Social History Narrative  . Not on file   Social Determinants of Health   Financial Resource Strain: Not on file  Food Insecurity: Not on file  Transportation Needs: Not on file  Physical Activity: Not on file  Stress: Not on file  Social Connections: Not on file     Family History: The patient's family history includes Heart attack in his father; Heart disease in his father and mother.  ROS:   Please see the history of present illness.    Tolerating low-dose rosuvastatin without difficulty.  Also on Zetia.  Most recent LDL was 91 on 09/28/2020.  Total cholesterol 155.  Has follow-up in lipid clinic scheduled.  All other systems reviewed and are negative.  EKGs/Labs/Other Studies Reviewed:    The following studies were reviewed today: Coronary CTA performed January 2022: IMPRESSION: 1. Coronary calcium score of 1283. This was 82nd percentile for age and sex  matched control.  2. Normal coronary origin with right dominance.  3. There is diffuse, calcified plaque. There is severe (>70%) stenosis in the proximal and mid LAD and ostial LCX. There is moderate (50-69%) RCA plaque. Each region is heavily calcified with can lead to blooming artifact and over-estimation of disease severity. Will send for FFRct.  CT FFR 08/18/2020:  IMPRESSION: 1. FFRct findings are consistent with severe stenosis in the mid to distal LAD.  2.  FFRct findings in the mid LCX are indeterminate.  3. FFRct findings in the RCA are consistent with non-obstructive disease.  4. Consider aggressive medical management vs. Consideration  for cardiac catheterization.  Electronically Signed  EKG:  EKG not repeated  Recent Labs: 08/07/2020: BUN 16; Creatinine, Ser 1.19; Potassium 4.0; Sodium 143 09/28/2020: ALT 14  Recent Lipid Panel    Component Value Date/Time   CHOL 155 09/28/2020 0736   TRIG 106 09/28/2020 0736   HDL 45 09/28/2020 0736   CHOLHDL 3.4 09/28/2020 0736   LDLCALC 91 09/28/2020 0736    Physical Exam:    VS:  BP 124/70   Pulse 60   Ht 5\' 10"  (1.778 m)   Wt 169 lb (76.7 kg)   SpO2 97%   BMI 24.25 kg/m     Wt Readings from Last 3 Encounters:  10/29/20 169 lb (76.7 kg)  08/25/20 186 lb 3.2 oz (84.5 kg)  08/07/20 193 lb (87.5 kg)     GEN: Lost weight, proximally 20 pounds.. No acute distress HEENT: Normal NECK: No JVD. LYMPHATICS: No lymphadenopathy CARDIAC: No murmur. RRR no gallop, or edema. VASCULAR:  Normal Pulses. No bruits. RESPIRATORY:  Clear to auscultation without rales, wheezing or rhonchi  ABDOMEN: Soft, non-tender, non-distended, No pulsatile mass, MUSCULOSKELETAL: No deformity  SKIN: Warm and dry NEUROLOGIC:  Alert and oriented x 3 PSYCHIATRIC:  Normal affect   ASSESSMENT:    1. CAD in native artery   2. Hyperlipidemia with target LDL less than 70   3. DOE (dyspnea on exertion)   4. Primary hypertension   5.  PAD (peripheral artery disease) ([facility])    PLAN:    In order of problems listed above:  1. Stable without angina.  Primary prevention discussed.  Not on anti-ischemic therapy.  Has not required nitroglycerin.  Has had no anginal discomfort.  We discussed the coronary CT a/FFR demonstrating distal LAD ischemia.  In absence of symptoms, we will continue medical therapy and preventive therapy. 2. Continue ezetimibe and statin therapy.  Follow-up lipid panel. 3. Dyspnea on exertion has resolved with weight loss and exercise. 4. Excellent blood pressure control on Lotrel 5/20 mg 1/day. 5. He denies claudication.  Overall education and awareness concerning secondary risk prevention was discussed in detail: LDL less than 70, hemoglobin A1c less than 7, blood pressure target less than 130/80 mmHg, >150 minutes of moderate aerobic activity per week, avoidance of smoking, weight control (via diet and exercise), and continued surveillance/management of/for obstructive sleep apnea.    Medication Adjustments/Labs and Tests Ordered: Current medicines are reviewed at length with the patient today.  Concerns regarding medicines are outlined above.  No orders of the defined types were placed in this encounter.  No orders of the defined types were placed in this encounter.   Patient Instructions  Medication Instructions:  Your physician recommends that you continue on your current medications as directed. Please refer to the Current Medication list given to you today.  *If you need a refill on your cardiac medications before your next appointment, please call your pharmacy*   Lab Work: None If you have labs (blood work) drawn today and your tests are completely normal, you will receive your results only by: Marland Kitchen MyChart Message (if you have MyChart) OR . A paper copy in the mail If you have any lab test that is abnormal or we need to change your treatment, we will call you to review the  results.   Testing/Procedures: None   Follow-Up: At [facility], you and your health needs are our priority.  As part of our continuing mission to provide you with exceptional heart care, we have created  designated Provider Care Teams.  These Care Teams include your primary Cardiologist (physician) and Advanced Practice Providers (APPs -  Physician Assistants and Nurse Practitioners) who all work together to provide you with the care you need, when you need it.  We recommend signing up for the patient portal called "MyChart".  Sign up information is provided on this After Visit Summary.  MyChart is used to connect with patients for Virtual Visits (Telemedicine).  Patients are able to view lab/test results, encounter notes, upcoming appointments, etc.  Non-urgent messages can be sent to your provider as well.   To learn more about what you can do with MyChart, go to [email or web address].    Your next appointment:   1 year(s)  The format for your next appointment:   In Person  Provider:   You may see Sinclair Grooms, MD or one of the following Advanced Practice Providers on your designated Care Team:    Kathyrn Drown, NP    Other Instructions      Signed, Sinclair Grooms, MD  10/29/2020 1:01 PM    [place]

## 2020-10-29 ENCOUNTER — Ambulatory Visit (INDEPENDENT_AMBULATORY_CARE_PROVIDER_SITE_OTHER): Payer: Medicare Other | Admitting: Interventional Cardiology

## 2020-10-29 ENCOUNTER — Other Ambulatory Visit: Payer: Self-pay

## 2020-10-29 ENCOUNTER — Encounter: Payer: Self-pay | Admitting: Interventional Cardiology

## 2020-10-29 VITALS — BP 124/70 | HR 60 | Ht 70.0 in | Wt 169.0 lb

## 2020-10-29 DIAGNOSIS — E785 Hyperlipidemia, unspecified: Secondary | ICD-10-CM | POA: Diagnosis not present

## 2020-10-29 DIAGNOSIS — R0609 Other forms of dyspnea: Secondary | ICD-10-CM

## 2020-10-29 DIAGNOSIS — I10 Essential (primary) hypertension: Secondary | ICD-10-CM

## 2020-10-29 DIAGNOSIS — I739 Peripheral vascular disease, unspecified: Secondary | ICD-10-CM | POA: Diagnosis not present

## 2020-10-29 DIAGNOSIS — R0600 Dyspnea, unspecified: Secondary | ICD-10-CM | POA: Diagnosis not present

## 2020-10-29 DIAGNOSIS — I2510 Atherosclerotic heart disease of native coronary artery without angina pectoris: Secondary | ICD-10-CM

## 2020-10-29 NOTE — Patient Instructions (Signed)

## 2020-12-14 ENCOUNTER — Other Ambulatory Visit: Payer: Self-pay

## 2020-12-14 ENCOUNTER — Other Ambulatory Visit: Payer: Medicare Other | Admitting: *Deleted

## 2020-12-14 DIAGNOSIS — R7303 Prediabetes: Secondary | ICD-10-CM | POA: Diagnosis not present

## 2020-12-14 DIAGNOSIS — E782 Mixed hyperlipidemia: Secondary | ICD-10-CM | POA: Diagnosis not present

## 2020-12-14 DIAGNOSIS — G2581 Restless legs syndrome: Secondary | ICD-10-CM | POA: Diagnosis not present

## 2020-12-14 DIAGNOSIS — R252 Cramp and spasm: Secondary | ICD-10-CM | POA: Diagnosis not present

## 2020-12-14 DIAGNOSIS — I7091 Generalized atherosclerosis: Secondary | ICD-10-CM | POA: Diagnosis not present

## 2020-12-14 DIAGNOSIS — K219 Gastro-esophageal reflux disease without esophagitis: Secondary | ICD-10-CM | POA: Diagnosis not present

## 2020-12-14 DIAGNOSIS — N400 Enlarged prostate without lower urinary tract symptoms: Secondary | ICD-10-CM | POA: Diagnosis not present

## 2020-12-14 DIAGNOSIS — E785 Hyperlipidemia, unspecified: Secondary | ICD-10-CM

## 2020-12-14 DIAGNOSIS — M519 Unspecified thoracic, thoracolumbar and lumbosacral intervertebral disc disorder: Secondary | ICD-10-CM | POA: Diagnosis not present

## 2020-12-14 DIAGNOSIS — N182 Chronic kidney disease, stage 2 (mild): Secondary | ICD-10-CM | POA: Diagnosis not present

## 2020-12-14 DIAGNOSIS — Z0000 Encounter for general adult medical examination without abnormal findings: Secondary | ICD-10-CM | POA: Diagnosis not present

## 2020-12-14 DIAGNOSIS — I10 Essential (primary) hypertension: Secondary | ICD-10-CM | POA: Diagnosis not present

## 2020-12-14 LAB — HEPATIC FUNCTION PANEL
ALT: 12 IU/L (ref 0–44)
AST: 13 IU/L (ref 0–40)
Albumin: 4.5 g/dL (ref 3.7–4.7)
Alkaline Phosphatase: 81 IU/L (ref 44–121)
Bilirubin Total: 0.5 mg/dL (ref 0.0–1.2)
Bilirubin, Direct: 0.15 mg/dL (ref 0.00–0.40)
Total Protein: 6.9 g/dL (ref 6.0–8.5)

## 2020-12-14 LAB — LIPID PANEL
Chol/HDL Ratio: 2.7 ratio (ref 0.0–5.0)
Cholesterol, Total: 144 mg/dL (ref 100–199)
HDL: 53 mg/dL (ref >39)
LDL Chol Calc (NIH): 73 mg/dL (ref 0–99)
Triglycerides: 96 mg/dL (ref 0–149)
VLDL Cholesterol Cal: 18 mg/dL (ref 5–40)

## 2021-03-01 ENCOUNTER — Ambulatory Visit: Payer: Medicare Other | Admitting: Interventional Cardiology

## 2021-03-09 DIAGNOSIS — M109 Gout, unspecified: Secondary | ICD-10-CM | POA: Diagnosis not present

## 2021-04-19 DIAGNOSIS — N182 Chronic kidney disease, stage 2 (mild): Secondary | ICD-10-CM | POA: Diagnosis not present

## 2021-04-19 DIAGNOSIS — E782 Mixed hyperlipidemia: Secondary | ICD-10-CM | POA: Diagnosis not present

## 2021-04-19 DIAGNOSIS — I7091 Generalized atherosclerosis: Secondary | ICD-10-CM | POA: Diagnosis not present

## 2021-04-19 DIAGNOSIS — G4700 Insomnia, unspecified: Secondary | ICD-10-CM | POA: Diagnosis not present

## 2021-04-19 DIAGNOSIS — M109 Gout, unspecified: Secondary | ICD-10-CM | POA: Diagnosis not present

## 2021-04-19 DIAGNOSIS — I10 Essential (primary) hypertension: Secondary | ICD-10-CM | POA: Diagnosis not present

## 2021-04-19 DIAGNOSIS — Z23 Encounter for immunization: Secondary | ICD-10-CM | POA: Diagnosis not present

## 2021-04-19 DIAGNOSIS — R7303 Prediabetes: Secondary | ICD-10-CM | POA: Diagnosis not present

## 2021-04-19 DIAGNOSIS — G2581 Restless legs syndrome: Secondary | ICD-10-CM | POA: Diagnosis not present

## 2021-08-09 ENCOUNTER — Ambulatory Visit (INDEPENDENT_AMBULATORY_CARE_PROVIDER_SITE_OTHER): Payer: Medicare Other | Admitting: Dermatology

## 2021-08-09 ENCOUNTER — Other Ambulatory Visit: Payer: Self-pay

## 2021-08-09 DIAGNOSIS — L814 Other melanin hyperpigmentation: Secondary | ICD-10-CM | POA: Diagnosis not present

## 2021-08-09 DIAGNOSIS — L821 Other seborrheic keratosis: Secondary | ICD-10-CM | POA: Diagnosis not present

## 2021-08-09 DIAGNOSIS — Z85828 Personal history of other malignant neoplasm of skin: Secondary | ICD-10-CM

## 2021-08-09 DIAGNOSIS — D1800 Hemangioma unspecified site: Secondary | ICD-10-CM

## 2021-08-09 DIAGNOSIS — L578 Other skin changes due to chronic exposure to nonionizing radiation: Secondary | ICD-10-CM | POA: Diagnosis not present

## 2021-08-09 DIAGNOSIS — D229 Melanocytic nevi, unspecified: Secondary | ICD-10-CM | POA: Diagnosis not present

## 2021-08-09 DIAGNOSIS — C44519 Basal cell carcinoma of skin of other part of trunk: Secondary | ICD-10-CM | POA: Diagnosis not present

## 2021-08-09 DIAGNOSIS — C4491 Basal cell carcinoma of skin, unspecified: Secondary | ICD-10-CM

## 2021-08-09 DIAGNOSIS — L570 Actinic keratosis: Secondary | ICD-10-CM | POA: Diagnosis not present

## 2021-08-09 DIAGNOSIS — Z1283 Encounter for screening for malignant neoplasm of skin: Secondary | ICD-10-CM | POA: Diagnosis not present

## 2021-08-09 DIAGNOSIS — D485 Neoplasm of uncertain behavior of skin: Secondary | ICD-10-CM

## 2021-08-09 HISTORY — DX: Basal cell carcinoma of skin, unspecified: C44.91

## 2021-08-09 MED ORDER — FLUOROURACIL 5 % EX CREA: TOPICAL_CREAM | CUTANEOUS | 0 refills | Status: DC

## 2021-08-09 NOTE — Progress Notes (Signed)
Follow-Up Visit   Subjective  Paul Mann is a 76 y.o. male who presents for the following: UBSE (Hx of AK's, SCC - patient and wife have noticed scattered lesions on the face, ears, and back. Patient declines FBSE today.). The patient has spots, moles and lesions to be evaluated, some may be new or changing.  The following portions of the chart were reviewed this encounter and updated as appropriate:   Tobacco   Allergies   Meds   Problems   Med Hx   Surg Hx   Fam Hx      Review of Systems:  No other skin or systemic complaints except as noted in HPI or Assessment and Plan.  Objective  Well appearing patient in no apparent distress; mood and affect are within normal limits.  All skin waist up examined.  Face, ears Erythematous thin papules/macules with gritty scale.          Upper back spinal 1.5 cm crusted papule.       Assessment & Plan   Neoplasm of uncertain behavior of skin Upper back spinal  Epidermal / dermal shaving  Lesion diameter (cm):  1.5 Informed consent: discussed and consent obtained   Timeout: patient name, date of birth, surgical site, and procedure verified   Procedure prep:  Patient was prepped and draped in usual sterile fashion Prep type:  Isopropyl alcohol Anesthesia: the lesion was anesthetized in a standard fashion   Anesthetic:  1% lidocaine w/ epinephrine 1-100,000 buffered w/ 8.4% NaHCO3 Instrument used: flexible razor blade   Hemostasis achieved with: pressure, aluminum chloride and electrodesiccation   Outcome: patient tolerated procedure well   Post-procedure details: sterile dressing applied and wound care instructions given   Dressing type: bandage and petrolatum    Destruction of lesion Complexity: extensive   Destruction method: electrodesiccation and curettage   Informed consent: discussed and consent obtained   Timeout:  patient name, date of birth, surgical site, and procedure verified Procedure prep:  Patient was  prepped and draped in usual sterile fashion Prep type:  Isopropyl alcohol Anesthesia: the lesion was anesthetized in a standard fashion   Anesthetic:  1% lidocaine w/ epinephrine 1-100,000 buffered w/ 8.4% NaHCO3 Curettage performed in three different directions: Yes   Electrodesiccation performed over the curetted area: Yes   Lesion length (cm):  1.5 Lesion width (cm):  1.5 Final wound size (cm):  2.1 Hemostasis achieved with:  pressure, aluminum chloride and electrodesiccation Outcome: patient tolerated procedure well with no complications   Post-procedure details: sterile dressing applied and wound care instructions given   Dressing type: bandage and petrolatum    Specimen 1 - Surgical pathology Differential Diagnosis: D48.5 r/o BCC vs other  ED&C today  Check Margins: No  Lentigines - Scattered tan macules - Due to sun exposure - Benign-appearing, observe - Recommend daily broad spectrum sunscreen SPF 30+ to sun-exposed areas, reapply every 2 hours as needed. - Call for any changes  Seborrheic Keratoses - Mann-on, waxy, tan-brown papules and/or plaques  - Benign-appearing - Discussed benign etiology and prognosis. - Observe - Call for any changes  Melanocytic Nevi - Tan-brown and/or pink-flesh-colored symmetric macules and papules - Benign appearing on exam today - Observation - Call clinic for new or changing moles - Recommend daily use of broad spectrum spf 30+ sunscreen to sun-exposed areas.   Hemangiomas - Red papules - Discussed benign nature - Observe - Call for any changes  AK (actinic keratosis) Face, ears Start 5FU/Calcipotriene mix BID  x 7 days to the temples, rim of ears, and spot treat any rough or scabby areas of face or neck.   fluorouracil (EFUDEX) 5 % cream - Face, ears Apply to the temples, rim of the ears, and spot treat the face BID x 7 days. Actinic Damage - Severe, confluent actinic changes with pre-cancerous actinic keratoses  - Severe,  chronic, not at goal, secondary to cumulative UV radiation exposure over time - diffuse scaly erythematous macules and papules with underlying dyspigmentation - Discussed Prescription "Field Treatment" for Severe, Chronic Confluent Actinic Changes with Pre-Cancerous Actinic Keratoses Field treatment involves treatment of an entire area of skin that has confluent Actinic Changes (Sun/ Ultraviolet light damage) and PreCancerous Actinic Keratoses by method of PhotoDynamic Therapy (PDT) and/or prescription Topical Chemotherapy agents such as 5-fluorouracil, 5-fluorouracil/calcipotriene, and/or imiquimod.  The purpose is to decrease the number of clinically evident and subclinical PreCancerous lesions to prevent progression to development of skin cancer by chemically destroying early precancer changes that may or may not be visible.  It has been shown to reduce the risk of developing skin cancer in the treated area. As a result of treatment, redness, scaling, crusting, and open sores may occur during treatment course. One or more than one of these methods may be used and may have to be used several times to control, suppress and eliminate the PreCancerous changes. Discussed treatment course, expected reaction, and possible side effects. - Recommend daily broad spectrum sunscreen SPF 30+ to sun-exposed areas, reapply every 2 hours as needed.  - Staying in the shade or wearing long sleeves, sun glasses (UVA+UVB protection) and wide brim hats (4-inch brim around the entire circumference of the hat) are also recommended. - Call for new or changing lesions. - Start 5FU/Calcipotriene mix BID x 7 days to the rim of both ears, temples, and spot tx on the face.  History of Basal Cell Carcinoma of the Skin - No evidence of recurrence today - Recommend regular full body skin exams - Recommend daily broad spectrum sunscreen SPF 30+ to sun-exposed areas, reapply every 2 hours as needed.  - Call if any new or changing  lesions are noted between office visits  History of Squamous Cell Carcinoma of the Skin - No evidence of recurrence today - No lymphadenopathy - Recommend regular full body skin exams - Recommend daily broad spectrum sunscreen SPF 30+ to sun-exposed areas, reapply every 2 hours as needed.  - Call if any new or changing lesions are noted between office visits  Skin cancer screening performed today.  Return in about 1 month (around 09/09/2021) for AK follow up .  Luther Redo, CMA, am acting as scribe for Sarina Ser, MD . Documentation: I have reviewed the above documentation for accuracy and completeness, and I agree with the above.  Sarina Ser, MD

## 2021-08-09 NOTE — Patient Instructions (Signed)
If You Need Anything After Your Visit ° °If you have any questions or concerns for your doctor, please call our main line at [phone] and press option 4 to reach your doctor's medical assistant. If no one answers, please leave a voicemail as directed and we will return your call as soon as possible. Messages left after 4 pm will be answered the following business day.  ° °You may also send us a message via MyChart. We typically respond to MyChart messages within 1-2 business days. ° °For prescription refills, please ask your pharmacy to contact our office. Our fax number is [phone]. ° °If you have an urgent issue when the clinic is closed that cannot wait until the next business day, you can page your doctor at the number below.   ° °Please note that while we do our best to be available for urgent issues outside of office hours, we are not available 24/7.  ° °If you have an urgent issue and are unable to reach us, you may choose to seek medical care at your doctor's office, retail clinic, urgent care center, or emergency room. ° °If you have a medical emergency, please immediately call 911 or go to the emergency department. ° °Pager Numbers ° °- Dr. Kowalski: [phone] ° °- Dr. Moye: [phone] ° °- Dr. Stewart: [phone] ° °In the event of inclement weather, please call our main line at [phone] for an update on the status of any delays or closures. ° °Dermatology Medication Tips: °Please keep the boxes that topical medications come in in order to help keep track of the instructions about where and how to use these. Pharmacies typically print the medication instructions only on the boxes and not directly on the medication tubes.  ° °If your medication is too expensive, please contact our office at [phone] option 4 or send us a message through MyChart.  ° °We are unable to tell what your co-pay for medications will be in advance as this is different depending on your insurance coverage.  However, we may be able to find a substitute medication at lower cost or fill out paperwork to get insurance to cover a needed medication.  ° °If a prior authorization is required to get your medication covered by your insurance company, please allow us 1-2 business days to complete this process. ° °Drug prices often vary depending on where the prescription is filled and some pharmacies may offer cheaper prices. ° °The website www.goodrx.com contains coupons for medications through different pharmacies. The prices here do not account for what the cost may be with help from insurance (it may be cheaper with your insurance), but the website can give you the price if you did not use any insurance.  °- You can print the associated coupon and take it with your prescription to the pharmacy.  °- You may also stop by our office during regular business hours and pick up a GoodRx coupon card.  °- If you need your prescription sent electronically to a different pharmacy, notify our office through [facility] MyChart or by phone at [phone] option 4. ° ° ° ° °Si Usted Necesita Algo Después de Su Visita ° °También puede enviarnos un mensaje a través de MyChart. Por lo general respondemos a los mensajes de MyChart en el transcurso de 1 a 2 días hábiles. ° °Para renovar recetas, por favor pida a su farmacia que se ponga en contacto con nuestra oficina. Nuestro número de fax es el [phone]. ° °Si tiene   un asunto urgente cuando la clnica est cerrada y que no puede esperar hasta el siguiente da hbil, puede llamar/localizar a su doctor(a) al nmero que aparece a continuacin.   Por favor, tenga en cuenta que aunque hacemos todo lo posible para estar disponibles para asuntos urgentes fuera del horario de [place], no estamos disponibles las 24 horas del da, los 7 das de la [place].   Si tiene un problema urgente y no puede comunicarse con nosotros, puede optar por buscar atencin mdica  en el consultorio de su  doctor(a), en una clnica privada, en un centro de atencin urgente o en una sala de emergencias.  Si tiene Engineering geologist, por favor llame inmediatamente al 911 o vaya a la sala de emergencias.  Nmeros de bper  - Dr. Nehemiah Massed: [phone]  - Dra. Moye: [phone]  - Dra. Nicole Kindred: [phone]  En caso de inclemencias del [place], por favor llame a Johnsie Kindred principal al [phone] para una actualizacin sobre el [place] de cualquier retraso o cierre.  Consejos para la medicacin en dermatologa: Por favor, guarde las cajas en las que vienen los medicamentos de uso tpico para ayudarle a seguir las instrucciones sobre dnde y cmo usarlos. Las farmacias generalmente imprimen las instrucciones del medicamento slo en las cajas y no directamente en los tubos del [place].   Si su medicamento es muy caro, por favor, pngase en contacto con Zigmund Daniel llamando al [phone] y presione la opcin 4 o envenos un mensaje a travs de Pharmacist, community.   No podemos decirle cul ser su copago por los medicamentos por adelantado ya que esto es diferente dependiendo de la cobertura de su seguro. Sin embargo, es posible que podamos encontrar un medicamento sustituto a Electrical engineer un formulario para que el seguro cubra el medicamento que se considera necesario.   Si se requiere una autorizacin previa para que su compaa de seguros [place] su medicamento, por favor permtanos de 1 a 2 das hbiles para completar este proceso.  Los precios de los medicamentos varan con frecuencia dependiendo del Environmental consultant de dnde se surte la receta y alguna farmacias pueden ofrecer precios ms baratos.  El sitio web www.goodrx.com tiene cupones para medicamentos de Airline pilot. Los precios aqu no tienen en cuenta lo que podra costar con la ayuda del seguro (puede ser ms barato con su seguro), pero el sitio web puede darle el precio si no utiliz Research scientist (physical sciences).  - Puede imprimir el cupn  correspondiente y llevarlo con su receta a la farmacia.  - Tambin puede pasar por nuestra oficina durante el horario de atencin regular y Charity fundraiser una tarjeta de cupones de GoodRx.  - Si necesita que su receta se enve electrnicamente a una farmacia diferente, informe a nuestra oficina a travs de MyChart de [place] o por telfono llamando al [phone] y presione la opcin 4.  Start 5-fluorouracil/calcipotriene cream twice a day for 7 days to affected areas including the ear rims, temples, and spot treating the face. Prescription sent to [facility]. Patient provided with contact information for pharmacy and advised the pharmacy will mail the prescription to their home. Patient provided with handout reviewing treatment course and side effects and advised to call or message [place] on MyChart with any concerns.  5-fluorouracil/calcipotriene cream is is a type of field treatment used to treat precancers, thin skin cancers, and areas of sun damage. Reviewed expected reaction including irritation and mild inflammation potentially progressing to more severe inflammation including redness, scaling, crusting and  open sores/erosions.  Reviewed if too much irritation occurs, ensure application of only a thin layer and decrease frequency of use to achieve a tolerable level of inflammation. Recommend applying Vaseline ointment to open sores as needed.  Minimize sun exposure while under treatment. Recommend daily broad spectrum sunscreen SPF 30+ to sun-exposed areas, reapply every 2 hours as needed.  Electrodesiccation and Curettage (Scrape and Burn) Wound Care Instructions  Leave the original bandage on for 24 hours if possible.  If the bandage becomes soaked or soiled before that time, it is OK to remove it and examine the wound.  A small amount of post-operative bleeding is normal.  If excessive bleeding occurs, remove the bandage, place gauze over the site and apply continuous pressure (no peeking) over  the area for 30 minutes. If this does not work, please call our clinic as soon as possible or page your doctor if it is after hours.   Once a day, cleanse the wound with soap and water. It is fine to shower. If a thick crust develops you may use a Q-tip dipped into dilute hydrogen peroxide (mix 1:1 with water) to dissolve it.  Hydrogen peroxide can slow the healing process, so use it only as needed.    After washing, apply petroleum jelly (Vaseline) or an antibiotic ointment if your doctor prescribed one for you, followed by a bandage.    For best healing, the wound should be covered with a layer of ointment at all times. If you are not able to keep the area covered with a bandage to hold the ointment in place, this may mean re-applying the ointment several times a day.  Continue this wound care until the wound has healed and is no longer open. It may take several weeks for the wound to heal and close.  Itching and mild discomfort is normal during the healing process.  If you have any discomfort, you can take Tylenol (acetaminophen) or ibuprofen as directed on the bottle. (Please do not take these if you have an allergy to them or cannot take them for another reason).  Some redness, tenderness and white or yellow material in the wound is normal healing.  If the area becomes very sore and red, or develops a thick yellow-green material (pus), it may be infected; please notify us.    Wound healing continues for up to one year following surgery. It is not unusual to experience pain in the scar from time to time during the interval.  If the pain becomes severe or the scar thickens, you should notify the office.    A slight amount of redness in a scar is expected for the first six months.  After six months, the redness will fade and the scar will soften and fade.  The color difference becomes less noticeable with time.  If there are any problems, return for a post-op surgery check at your earliest  convenience.  To improve the appearance of the scar, you can use silicone scar gel, cream, or sheets (such as Mederma or Serica) every night for up to one year. These are available over the counter (without a prescription).  Please call our office at [phone] for any questions or concerns.

## 2021-08-10 ENCOUNTER — Encounter: Payer: Self-pay | Admitting: Dermatology

## 2021-08-11 ENCOUNTER — Telehealth: Payer: Self-pay

## 2021-08-11 NOTE — Telephone Encounter (Signed)
-----   Message from Ralene Bathe, MD sent at 08/10/2021  7:01 PM EST ----- Diagnosis Skin , upper back spinal BASAL CELL CARCINOMA, SUPERFICIAL AND NODULAR PATTERNS, CLOSE TO MARGIN  Cancer - BCC Already treated Recheck next visit

## 2021-08-11 NOTE — Telephone Encounter (Signed)
Advised pts wife of bx results/sh ?

## 2021-08-16 DIAGNOSIS — I7091 Generalized atherosclerosis: Secondary | ICD-10-CM | POA: Diagnosis not present

## 2021-08-16 DIAGNOSIS — N182 Chronic kidney disease, stage 2 (mild): Secondary | ICD-10-CM | POA: Diagnosis not present

## 2021-08-16 DIAGNOSIS — I10 Essential (primary) hypertension: Secondary | ICD-10-CM | POA: Diagnosis not present

## 2021-08-16 DIAGNOSIS — G4700 Insomnia, unspecified: Secondary | ICD-10-CM | POA: Diagnosis not present

## 2021-08-16 DIAGNOSIS — E782 Mixed hyperlipidemia: Secondary | ICD-10-CM | POA: Diagnosis not present

## 2021-08-16 DIAGNOSIS — R7303 Prediabetes: Secondary | ICD-10-CM | POA: Diagnosis not present

## 2021-08-16 DIAGNOSIS — K219 Gastro-esophageal reflux disease without esophagitis: Secondary | ICD-10-CM | POA: Diagnosis not present

## 2021-09-08 ENCOUNTER — Ambulatory Visit (INDEPENDENT_AMBULATORY_CARE_PROVIDER_SITE_OTHER): Payer: Medicare Other | Admitting: Dermatology

## 2021-09-08 ENCOUNTER — Other Ambulatory Visit: Payer: Self-pay

## 2021-09-08 DIAGNOSIS — L570 Actinic keratosis: Secondary | ICD-10-CM

## 2021-09-08 DIAGNOSIS — L820 Inflamed seborrheic keratosis: Secondary | ICD-10-CM

## 2021-09-08 DIAGNOSIS — L578 Other skin changes due to chronic exposure to nonionizing radiation: Secondary | ICD-10-CM | POA: Diagnosis not present

## 2021-09-08 MED ORDER — FLUOROURACIL 5 % EX CREA: TOPICAL_CREAM | CUTANEOUS | 0 refills | Status: DC

## 2021-09-08 NOTE — Patient Instructions (Addendum)
In one month start 5-fluorouracil/calcipotriene cream twice a day for 7 days to affected areas including the face and ears. Prescription sent to [facility]. Patient provided with contact information for pharmacy and advised the pharmacy will mail the prescription to their home. Patient provided with handout reviewing treatment course and side effects and advised to call or message [place] on MyChart with any concerns.  5-fluorouracil/calcipotriene cream is is a type of field treatment used to treat precancers, thin skin cancers, and areas of sun damage. Expected reaction includes irritation and mild inflammation potentially progressing to more severe inflammation including redness, scaling, crusting and open sores/erosions.  If too much irritation occurs, ensure application of only a thin layer and decrease frequency of use to achieve a tolerable level of inflammation. Recommend applying Vaseline ointment to open sores as needed.  Minimize sun exposure while under treatment. Recommend daily broad spectrum sunscreen SPF 30+ to sun-exposed areas, reapply every 2 hours as needed.        If You Need Anything After Your Visit  If you have any questions or concerns for your doctor, please call our main line at [phone] and press option 4 to reach your doctor's medical assistant. If no one answers, please leave a voicemail as directed and we will return your call as soon as possible. Messages left after 4 pm will be answered the following business day.   You may also send [place] a message via [place]. We typically respond to MyChart messages within 1-2 business days.  For prescription refills, please ask your pharmacy to contact our office. Our fax number is [phone].  If you have an urgent issue when the clinic is closed that cannot wait until the next business day, you can page your doctor at the number below.    Please note that while we do our best to be available for urgent issues outside of  office hours, we are not available 24/7.   If you have an urgent issue and are unable to reach [place], you may choose to seek medical care at your doctor's office, retail clinic, urgent care center, or emergency room.  If you have a medical emergency, please immediately call 911 or go to the emergency department.  Pager Numbers  - Dr. Nehemiah Massed: [phone]  - Dr. Laurence Ferrari: [phone]  - Dr. Nicole Kindred: [phone]  In the event of inclement weather, please call our main line at [phone] for an update on the status of any delays or closures.  Dermatology Medication Tips: Please keep the boxes that topical medications come in in order to help keep track of the instructions about where and how to use these. Pharmacies typically print the medication instructions only on the boxes and not directly on the medication tubes.   If your medication is too expensive, please contact our office at [phone] option 4 or send [place] a message through [place].   We are unable to tell what your co-pay for medications will be in advance as this is different depending on your insurance coverage. However, we may be able to find a substitute medication at lower cost or fill out paperwork to get insurance to cover a needed medication.   If a prior authorization is required to get your medication covered by your insurance company, please allow [place] 1-2 business days to complete this process.  Drug prices often vary depending on where the prescription is filled and some pharmacies may offer cheaper prices.  The website www.goodrx.com contains coupons for medications through different  pharmacies. The prices here do not account for what the cost may be with help from insurance (it may be cheaper with your insurance), but the website can give you the price if you did not use any insurance.  - You can print the associated coupon and take it with your prescription to the pharmacy.  - You may also stop by our office during  regular business hours and pick up a GoodRx coupon card.  - If you need your prescription sent electronically to a different pharmacy, notify our office through [facility] or by phone at [phone] option 4.     Si Usted Necesita Algo Despus de Su Visita  Tambin puede enviarnos un mensaje a travs de Pharmacist, community. Por lo general respondemos a los mensajes de MyChart en el transcurso de 1 a 2 das hbiles.  Para renovar recetas, por favor pida a su farmacia que se ponga en contacto con nuestra oficina. Harland Dingwall de fax es [place] [phone].  Si tiene un asunto urgente cuando la clnica est cerrada y que no puede esperar hasta el siguiente da hbil, puede llamar/localizar a su doctor(a) al nmero que aparece a continuacin.   Por favor, tenga en cuenta que aunque hacemos todo lo posible para estar disponibles para asuntos urgentes fuera del horario de [place], no estamos disponibles las 24 horas del da, los 7 das de la [place].   Si tiene un problema urgente y no puede comunicarse con nosotros, puede optar por buscar atencin mdica  en el consultorio de su doctor(a), en una clnica privada, en un centro de atencin urgente o en una sala de emergencias.  Si tiene Engineering geologist, por favor llame inmediatamente al 911 o vaya a la sala de emergencias.  Nmeros de bper  - Dr. Nehemiah Massed: [phone]  - Dra. Moye: [phone]  - Dra. Nicole Kindred: [phone]  En caso de inclemencias del [place], por favor llame a Johnsie Kindred principal al [phone] para una actualizacin sobre el [place] de cualquier retraso o cierre.  Consejos para la medicacin en dermatologa: Por favor, guarde las cajas en las que vienen los medicamentos de uso tpico para ayudarle a seguir las instrucciones sobre dnde y cmo usarlos. Las farmacias generalmente imprimen las instrucciones del medicamento slo en las cajas y no directamente en los tubos del [place].   Si su medicamento es muy  caro, por favor, pngase en contacto con Zigmund Daniel llamando al [phone] y presione la opcin 4 o envenos un mensaje a travs de Pharmacist, community.   No podemos decirle cul ser su copago por los medicamentos por adelantado ya que esto es diferente dependiendo de la cobertura de su seguro. Sin embargo, es posible que podamos encontrar un medicamento sustituto a Electrical engineer un formulario para que el seguro cubra el medicamento que se considera necesario.   Si se requiere una autorizacin previa para que su compaa de seguros [place] su medicamento, por favor permtanos de 1 a 2 das hbiles para completar este proceso.  Los precios de los medicamentos varan con frecuencia dependiendo del Environmental consultant de dnde se surte la receta y alguna farmacias pueden ofrecer precios ms baratos.  El sitio web www.goodrx.com tiene cupones para medicamentos de Airline pilot. Los precios aqu no tienen en cuenta lo que podra costar con la ayuda del seguro (puede ser ms barato con su seguro), pero el sitio web puede darle el precio si no utiliz Research scientist (physical sciences).  - Puede imprimir el cupn correspondiente y llevarlo con su  receta a la farmacia.  - Tambin puede pasar por nuestra oficina durante el horario de atencin regular y Charity fundraiser una tarjeta de cupones de GoodRx.  - Si necesita que su receta se enve electrnicamente a una farmacia diferente, informe a nuestra oficina a travs de MyChart de [place] o por telfono llamando al [phone] y presione la opcin 4.

## 2021-09-08 NOTE — Progress Notes (Signed)
Follow-Up Visit   Subjective  Paul Mann is a 76 y.o. male who presents for the following: Actinic Keratosis (Patient is here today to recheck face and ears for new or persistent skin lesions. S/P 5FU/Calcipotriene cream to the face and ears BID x 7 days. ). The patient has spots, moles and lesions to be evaluated, some may be new or changing.  The following portions of the chart were reviewed this encounter and updated as appropriate:   Tobacco   Allergies   Meds   Problems   Med Hx   Surg Hx   Fam Hx      Review of Systems:  No other skin or systemic complaints except as noted in HPI or Assessment and Plan.  Objective  Well appearing patient in no apparent distress; mood and affect are within normal limits.  A focused examination was performed including the face and ears. Relevant physical exam findings are noted in the Assessment and Plan.  Face, ears (17) Erythematous thin papules/macules with gritty scale.   R temple x 1 Crust.   L nasal bridge x 1 Erythematous stuck-on, waxy papule or plaque   Assessment & Plan  AK (actinic keratosis) (17) Face, ears  Destruction of lesion - Face, ears Complexity: simple   Destruction method: cryotherapy   Informed consent: discussed and consent obtained   Timeout:  patient name, date of birth, surgical site, and procedure verified Lesion destroyed using liquid nitrogen: Yes   Region frozen until ice ball extended beyond lesion: Yes   Outcome: patient tolerated procedure well with no complications   Post-procedure details: wound care instructions given    Related Medications fluorouracil (EFUDEX) 5 % cream Apply to the temples, rim of the ears, and spot treat the face BID x 7 days.  Hypertrophic actinic keratosis R temple x 1  Recheck at follow up appointment.   Destruction of lesion - R temple x 1 Complexity: simple   Destruction method: cryotherapy   Informed consent: discussed and consent obtained   Timeout:   patient name, date of birth, surgical site, and procedure verified Lesion destroyed using liquid nitrogen: Yes   Region frozen until ice ball extended beyond lesion: Yes   Outcome: patient tolerated procedure well with no complications   Post-procedure details: wound care instructions given    Inflamed seborrheic keratosis L nasal bridge x 1  Destruction of lesion - L nasal bridge x 1 Complexity: simple   Destruction method: cryotherapy   Informed consent: discussed and consent obtained   Timeout:  patient name, date of birth, surgical site, and procedure verified Lesion destroyed using liquid nitrogen: Yes   Region frozen until ice ball extended beyond lesion: Yes   Outcome: patient tolerated procedure well with no complications   Post-procedure details: wound care instructions given    Actinic Damage - Severe, confluent actinic changes with pre-cancerous actinic keratoses  - Severe, chronic, not at goal, secondary to cumulative UV radiation exposure over time - diffuse scaly erythematous macules and papules with underlying dyspigmentation - Discussed Prescription "Field Treatment" for Severe, Chronic Confluent Actinic Changes with Pre-Cancerous Actinic Keratoses Field treatment involves treatment of an entire area of skin that has confluent Actinic Changes (Sun/ Ultraviolet light damage) and PreCancerous Actinic Keratoses by method of PhotoDynamic Therapy (PDT) and/or prescription Topical Chemotherapy agents such as 5-fluorouracil, 5-fluorouracil/calcipotriene, and/or imiquimod.  The purpose is to decrease the number of clinically evident and subclinical PreCancerous lesions to prevent progression to development of skin cancer by  chemically destroying early precancer changes that may or may not be visible.  It has been shown to reduce the risk of developing skin cancer in the treated area. As a result of treatment, redness, scaling, crusting, and open sores may occur during treatment course.  One or more than one of these methods may be used and may have to be used several times to control, suppress and eliminate the PreCancerous changes. Discussed treatment course, expected reaction, and possible side effects. - Recommend daily broad spectrum sunscreen SPF 30+ to sun-exposed areas, reapply every 2 hours as needed.  - Staying in the shade or wearing long sleeves, sun glasses (UVA+UVB protection) and wide brim hats (4-inch brim around the entire circumference of the hat) are also recommended. - Call for new or changing lesions. - In one month repeat 5FU/Calcipotriene cream BID to the face and ears x 7 days.   Return in about 9 weeks (around 11/10/2021) for AK f/u .  Luther Redo, CMA, am acting as scribe for Sarina Ser, MD . Documentation: I have reviewed the above documentation for accuracy and completeness, and I agree with the above.  Sarina Ser, MD

## 2021-09-11 ENCOUNTER — Encounter: Payer: Self-pay | Admitting: Dermatology

## 2021-09-20 ENCOUNTER — Other Ambulatory Visit: Payer: Self-pay | Admitting: Interventional Cardiology

## 2021-09-27 ENCOUNTER — Other Ambulatory Visit: Payer: Self-pay | Admitting: Interventional Cardiology

## 2021-10-12 ENCOUNTER — Other Ambulatory Visit: Payer: Self-pay | Admitting: Interventional Cardiology

## 2021-11-11 ENCOUNTER — Ambulatory Visit (INDEPENDENT_AMBULATORY_CARE_PROVIDER_SITE_OTHER): Payer: Medicare Other | Admitting: Dermatology

## 2021-11-11 DIAGNOSIS — L578 Other skin changes due to chronic exposure to nonionizing radiation: Secondary | ICD-10-CM

## 2021-11-11 DIAGNOSIS — L570 Actinic keratosis: Secondary | ICD-10-CM

## 2021-11-11 NOTE — Progress Notes (Signed)
? ?  Follow-Up Visit ?  ?Subjective  ?Paul Mann is a 76 y.o. male who presents for the following: Actinic Keratosis (2 month follow up treated with LN2 and 5FU/Calcipotriene cream). ?The patient has spots, moles and lesions to be evaluated, some may be new or changing and the patient has concerns that these could be cancer. ? ?Accompanied by wife who contributes to history. ? ?The following portions of the chart were reviewed this encounter and updated as appropriate:  ? Tobacco  Allergies  Meds  Problems  Med Hx  Surg Hx  Fam Hx   ?  ?Review of Systems:  No other skin or systemic complaints except as noted in HPI or Assessment and Plan. ? ?Objective  ?Well appearing patient in no apparent distress; mood and affect are within normal limits. ? ?A focused examination was performed including face. Relevant physical exam findings are noted in the Assessment and Plan. ? ?Face/ears x 13, left neck x 2, right neck x 1 (16) ?Erythematous thin papules/macules with gritty scale.  ? ? ?Assessment & Plan  ? ?Actinic Damage ?- chronic, secondary to cumulative UV radiation exposure/sun exposure over time ?- diffuse scaly erythematous macules with underlying dyspigmentation ?- Recommend daily broad spectrum sunscreen SPF 30+ to sun-exposed areas, reapply every 2 hours as needed.  ?- Recommend staying in the shade or wearing long sleeves, sun glasses (UVA+UVB protection) and wide brim hats (4-inch brim around the entire circumference of the hat). ?- Call for new or changing lesions. ? ?AK (actinic keratosis) (16) ?Face/ears x 13, left neck x 2, right neck x 1 ? ?Destruction of lesion - Face/ears x 13, left neck x 2, right neck x 1 ?Complexity: simple   ?Destruction method: cryotherapy   ?Informed consent: discussed and consent obtained   ?Timeout:  patient name, date of birth, surgical site, and procedure verified ?Lesion destroyed using liquid nitrogen: Yes   ?Region frozen until ice ball extended beyond lesion: Yes    ?Outcome: patient tolerated procedure well with no complications   ?Post-procedure details: wound care instructions given   ? ?May plan field treatment at follow-up appointment. ? ?Return for 4-6 months , AK follow up. ? ?I, Ashok Cordia, CMA, am acting as scribe for Sarina Ser, MD . ?Documentation: I have reviewed the above documentation for accuracy and completeness, and I agree with the above. ? ?Sarina Ser, MD ? ?

## 2021-11-11 NOTE — Patient Instructions (Signed)

## 2021-11-20 ENCOUNTER — Encounter: Payer: Self-pay | Admitting: Dermatology

## 2021-12-01 DIAGNOSIS — H2513 Age-related nuclear cataract, bilateral: Secondary | ICD-10-CM | POA: Diagnosis not present

## 2021-12-17 ENCOUNTER — Other Ambulatory Visit: Payer: Self-pay | Admitting: Interventional Cardiology

## 2021-12-17 DIAGNOSIS — R7303 Prediabetes: Secondary | ICD-10-CM | POA: Diagnosis not present

## 2021-12-17 DIAGNOSIS — I10 Essential (primary) hypertension: Secondary | ICD-10-CM | POA: Diagnosis not present

## 2021-12-17 DIAGNOSIS — Z0000 Encounter for general adult medical examination without abnormal findings: Secondary | ICD-10-CM | POA: Diagnosis not present

## 2021-12-17 DIAGNOSIS — N400 Enlarged prostate without lower urinary tract symptoms: Secondary | ICD-10-CM | POA: Diagnosis not present

## 2021-12-17 DIAGNOSIS — N182 Chronic kidney disease, stage 2 (mild): Secondary | ICD-10-CM | POA: Diagnosis not present

## 2021-12-17 DIAGNOSIS — I7091 Generalized atherosclerosis: Secondary | ICD-10-CM | POA: Diagnosis not present

## 2021-12-17 DIAGNOSIS — G720 Drug-induced myopathy: Secondary | ICD-10-CM | POA: Diagnosis not present

## 2021-12-17 DIAGNOSIS — J309 Allergic rhinitis, unspecified: Secondary | ICD-10-CM | POA: Diagnosis not present

## 2021-12-17 DIAGNOSIS — E782 Mixed hyperlipidemia: Secondary | ICD-10-CM | POA: Diagnosis not present

## 2021-12-17 DIAGNOSIS — Z1331 Encounter for screening for depression: Secondary | ICD-10-CM | POA: Diagnosis not present

## 2021-12-17 DIAGNOSIS — G4700 Insomnia, unspecified: Secondary | ICD-10-CM | POA: Diagnosis not present

## 2021-12-17 DIAGNOSIS — M519 Unspecified thoracic, thoracolumbar and lumbosacral intervertebral disc disorder: Secondary | ICD-10-CM | POA: Diagnosis not present

## 2022-01-12 ENCOUNTER — Other Ambulatory Visit: Payer: Self-pay | Admitting: Interventional Cardiology

## 2022-01-17 NOTE — Progress Notes (Signed)
Cardiology Office Note:    Date:  01/20/2022   ID:  Paul Mann, DOB [birth year], MRN [id number]  PCP:  Wenda Low, MD  Cardiologist:  Paul Grooms, MD   Referring MD: Wenda Low, MD   Chief Complaint  Patient presents with   Coronary Artery Disease   Hypertension   Hyperlipidemia    History of Present Illness:    Paul Mann is a 76 y.o. male with a hx of  non-obstructive CAD by cor  angiography 2002 (40% mid LAD and mid RCA), with recent DOE and chest tightness. COR CTA with abnormal mid-distal LAD FFR 08/17/2020 treated medically.   He is not having angina.  He is having leg cramps in the middle of the night several times per month.  This bothers him significantly.  Lower back discomfort also gives him trouble.    Past Medical History:  Diagnosis Date   Actinic keratosis    Arthritis    Basal cell carcinoma 11/03/2008   Left med. clavicle   Basal cell carcinoma 09/28/2009   Left anterior neck   Basal cell carcinoma 09/28/2009   Left medial clavicle. Excised: 01/18/2010   Basal cell carcinoma 01/18/2010   Right post. top of shoulder   Basal cell carcinoma 01/18/2010   Left med. clavicle. Excised: 03/03/2010   Basal cell carcinoma 07/09/2013   Left postauricular   Basal cell carcinoma 07/09/2013   left lateral neck   Basal cell carcinoma of face 07/02/2014   Nodular pattern. Left temple   Basal cell carcinoma of face 05/17/2017   Nodular pattern. Right Zygoma. Tx: EDC   Basal cell carcinoma of trunk 01/18/2010   Superficial. Left mid to upper back, ~5.0cm lat. to spine.   Basal cell carcinoma of trunk 01/18/2010   Right mid to upper back, 4.0cm lat. to spine   Basal cell carcinoma of trunk 03/04/2015   Superficial. RIght low back lateral.   Basal cell carcinoma of trunk 02/10/2016   Superficial. Right upper back   Basal cell carcinoma of trunk 02/10/2016   Superficial. Left upper back.   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  pretibial just below knee   BCC (basal cell carcinoma of skin) 08/09/2021   Upper back spinal, EDC   Cancer ([place])    akin cancer areas removed   Coronary artery disease    Diverticulitis    DJD (degenerative joint disease)    bulging disc upper and lower back   Elevated cholesterol    GERD (gastroesophageal reflux disease)    Hypertension    Squamous cell carcinoma of face 09/28/2009   SCCis. Left forehead.   Squamous cell carcinoma of skin 11/03/2008   Well differentiated SCC. Left cheek/mid mandible. Excised: 12/10/2008   Squamous cell carcinoma of skin 10/21/2019   right lat neck/infra auricular   Squamous cell carcinoma of trunk 07/04/2018   Well differentiated SCC with superficial infiltration. Left suprasternal notch. Tx: EDC.    Past Surgical History:  Procedure Laterality Date   COLONOSCOPY WITH PROPOFOL N/A 02/22/2016   Procedure: COLONOSCOPY WITH PROPOFOL;  Surgeon: Garlan Fair, MD;  Location: [facility];  Service: Endoscopy;  Laterality: N/A;   moes procedure for skin cancer 2016     skin cancer area removed  02/10/2016   upper back and and face   tumor rmoved from right hand  1980   right hand   wrist surgery Right     Current Medications: Current Meds  Medication Sig   amLODipine-benazepril (LOTREL) 5-20 MG per capsule Take 1 capsule by mouth daily.    aspirin EC 81 MG tablet Take 81 mg by mouth daily.    cetirizine (ZYRTEC) 10 MG tablet Take 10 mg by mouth daily as needed for allergies.   clonazePAM (KLONOPIN) 0.5 MG tablet Take 0.5 mg by mouth daily as needed.   colchicine 0.6 MG tablet Take 1 tablet by mouth as needed.   docusate sodium (COLACE) 100 MG capsule Take 100 mg by mouth as needed.   ezetimibe (ZETIA) 10 MG tablet TAKE 1/2 TABLET BY MOUTH DAILY   fluticasone (FLONASE) 50 MCG/ACT nasal spray Place 1 spray into both nostrils daily.    Omega-3 Fatty Acids (FISH OIL) 1200 MG CPDR Take 1,200 mg by mouth daily.   omeprazole (PRILOSEC) 40 MG  capsule Take 40 mg by mouth every morning.   rosuvastatin (CRESTOR) 5 MG tablet TAKE 1 TABLET (5 MG TOTAL) BY MOUTH EVERY MONDAY, WEDNESDAY, AND FRIDAY.   [DISCONTINUED] esomeprazole (NEXIUM) 40 MG capsule Take 40 mg by mouth daily at 12 noon.   [DISCONTINUED] MAGNESIUM PO Take by mouth.     Allergies:   Ciprofloxacin, Clarithromycin, Codeine, Indomethacin, Penicillins, Uroxatral [alfuzosin], and Zocor [simvastatin]   Social History   Socioeconomic History   Marital status: Married    Spouse name: Not on file   Number of children: Not on file   Years of education: Not on file   Highest education level: Not on file  Occupational History   Not on file  Tobacco Use   Smoking status: Never   Smokeless tobacco: Never  Substance and Sexual Activity   Alcohol use: No   Drug use: No   Sexual activity: Not on file  Other Topics Concern   Not on file  Social History Narrative   Not on file   Social Determinants of Health   Financial Resource Strain: Not on file  Food Insecurity: Not on file  Transportation Needs: Not on file  Physical Activity: Not on file  Stress: Not on file  Social Connections: Not on file     Family History: The patient's family history includes Heart attack in his father; Heart disease in his father and mother.  ROS:   Please see the history of present illness.    Lower back discomfort limits him.  He still works, plays golf, walks.  All other systems reviewed and are negative.  EKGs/Labs/Other Studies Reviewed:    The following studies were reviewed today: Coronary CTA performed January 2022: IMPRESSION: 1. Coronary calcium score of 1283. This was 82nd percentile for age and sex matched control.   2. Normal coronary origin with right dominance.   3. There is diffuse, calcified plaque. There is severe (>70%) stenosis in the proximal and mid LAD and ostial LCX. There is moderate (50-69%) RCA plaque. Each region is heavily calcified with can lead  to blooming artifact and over-estimation of disease severity. Will send for FFRct.   CT FFR 08/18/2020:   IMPRESSION: 1. FFRct findings are consistent with severe stenosis in the mid to distal LAD.   2.  FFRct findings in the mid LCX are indeterminate.   3. FFRct findings in the RCA are consistent with non-obstructive disease.   4. Consider aggressive medical management vs. Consideration for cardiac catheterization.    EKG:  EKG sinus bradycardia at 49 bpm.  EKG is otherwise normal.  The prior tracing of February 2022 had a slightly fast heart rate  but was otherwise compatible.  Recent Labs: No results found for requested labs within last 365 days.  Recent Lipid Panel    Component Value Date/Time   CHOL 144 12/14/2020 0836   TRIG 96 12/14/2020 0836   HDL 53 12/14/2020 0836   CHOLHDL 2.7 12/14/2020 0836   LDLCALC 73 12/14/2020 0836    Physical Exam:    VS:  BP 132/70   Pulse (!) 49   Ht '5\' 10"'$  (1.778 m)   Wt 163 lb 12.8 oz (74.3 kg)   SpO2 97%   BMI 23.50 kg/m     Wt Readings from Last 3 Encounters:  01/20/22 163 lb 12.8 oz (74.3 kg)  10/29/20 169 lb (76.7 kg)  08/25/20 186 lb 3.2 oz (84.5 kg)     GEN: Appears younger than stated age. No acute distress HEENT: Normal NECK: No JVD. LYMPHATICS: No lymphadenopathy CARDIAC: No murmur. RRR no gallop, or edema. VASCULAR:  Normal Pulses. No bruits. RESPIRATORY:  Clear to auscultation without rales, wheezing or rhonchi  ABDOMEN: Soft, non-tender, non-distended, No pulsatile mass, MUSCULOSKELETAL: No deformity  SKIN: Warm and dry NEUROLOGIC:  Alert and oriented x 3 PSYCHIATRIC:  Normal affect   ASSESSMENT:    1. CAD in native artery   2. Hyperlipidemia with target LDL less than 70   3. PAD (peripheral artery disease) ([place])   4. Primary hypertension    PLAN:    In order of problems listed above:  Secondary prevention discussed.  It is essential to treat cholesterol.  He complains enough about low-dose statin  therapy that we should consider alternative treatment measures including PCSK9.  Refer back to the lipid clinic. Refer to lipid clinic to consider PCSK9.  He has high risk with both coronary and PAD. Leg pain although he does have back trouble and some of his leg pain may be neurogenic. Blood pressure is well controlled.  Continue amlodipine benazepril.  Overall education and awareness concerning secondary risk prevention was discussed in detail: LDL less than 70, hemoglobin A1c less than 7, blood pressure target less than 130/80 mmHg, >150 minutes of moderate aerobic activity per week, avoidance of smoking, weight control (via diet and exercise), and continued surveillance/management of/for obstructive sleep apnea.  Prolonged office visit with significant time spent concerning the importance of risk factor modification and the need to manage lipids.  Greater than 50% of the time of this visit was spent in counseling.  Medication Adjustments/Labs and Tests Ordered: Current medicines are reviewed at length with the patient today.  Concerns regarding medicines are outlined above.  No orders of the defined types were placed in this encounter.  No orders of the defined types were placed in this encounter.   There are no Patient Instructions on file for this visit.   Signed, Paul Grooms, MD  01/20/2022 8:35 AM    [place]

## 2022-01-20 ENCOUNTER — Encounter: Payer: Self-pay | Admitting: Interventional Cardiology

## 2022-01-20 ENCOUNTER — Ambulatory Visit (INDEPENDENT_AMBULATORY_CARE_PROVIDER_SITE_OTHER): Payer: Medicare Other | Admitting: Interventional Cardiology

## 2022-01-20 VITALS — BP 132/70 | HR 49 | Ht 70.0 in | Wt 163.8 lb

## 2022-01-20 DIAGNOSIS — I10 Essential (primary) hypertension: Secondary | ICD-10-CM

## 2022-01-20 DIAGNOSIS — I2510 Atherosclerotic heart disease of native coronary artery without angina pectoris: Secondary | ICD-10-CM | POA: Diagnosis not present

## 2022-01-20 DIAGNOSIS — I739 Peripheral vascular disease, unspecified: Secondary | ICD-10-CM

## 2022-01-20 DIAGNOSIS — E785 Hyperlipidemia, unspecified: Secondary | ICD-10-CM

## 2022-01-20 NOTE — Patient Instructions (Signed)
Medication Instructions:  Your physician recommends that you continue on your current medications as directed. Please refer to the Current Medication list given to you today.  *If you need a refill on your cardiac medications before your next appointment, please call your pharmacy*  Lab Work: NONE  Testing/Procedures: NONE  Follow-Up: At [facility], you and your health needs are our priority.  As part of our continuing mission to provide you with exceptional heart care, we have created designated Provider Care Teams.  These Care Teams include your primary Cardiologist (physician) and Advanced Practice Providers (APPs -  Physician Assistants and Nurse Practitioners) who all work together to provide you with the care you need, when you need it.  Your next appointment:   1 year(s)  The format for your next appointment:   In Person  Provider:   Sinclair Grooms, MD {  Other Instructions Your physician has referred you to our [facility] here at our [facility] office. A scheduler will call you to schedule an appointment to see our Pharmacist to discuss alternative cholesterol-lowering therapies.  Important Information About Sugar

## 2022-01-27 ENCOUNTER — Telehealth: Payer: Self-pay | Admitting: Pharmacist

## 2022-01-27 ENCOUNTER — Ambulatory Visit (INDEPENDENT_AMBULATORY_CARE_PROVIDER_SITE_OTHER): Payer: Medicare Other | Admitting: Pharmacist

## 2022-01-27 DIAGNOSIS — E785 Hyperlipidemia, unspecified: Secondary | ICD-10-CM

## 2022-01-27 DIAGNOSIS — I2510 Atherosclerotic heart disease of native coronary artery without angina pectoris: Secondary | ICD-10-CM | POA: Diagnosis not present

## 2022-01-27 MED ORDER — PRALUENT 75 MG/ML ~~LOC~~ SOAJ: 1.0000 | SUBCUTANEOUS | 11 refills | Status: DC

## 2022-01-27 NOTE — Telephone Encounter (Signed)
Praluent approved through 01/27/23. Patient and wife made aware. Labs scheduled for 10/2.

## 2022-01-27 NOTE — Progress Notes (Signed)
Patient ID: Paul Mann                 DOB: [birth year]                    MRN: [id number]     HPI: Paul Mann is a 76 y.o. male patient referred to lipid clinic by Dr. Tamala Julian. PMH is significant for non-obstructive CAD by cor  angiography 2002 (40% mid LAD and mid RCA), with recent DOE and chest tightness. COR CTA with abnormal mid-distal LAD FFR 08/17/2020 treated medically. Coronary calcium score of 1283. This was 82nd percentile for age and sex matched control. Patient seen in lipid clinic in March of 2022. He was continued on rosuvastatin '5mg'$  three times a week and started on ezetimibe '5mg'$  daily. Follow up LDL-C was 73 and then down to 68. Patient saw Dr. Tamala Julian 6/29 and reported continued painful muscle cramps at night.   Patient presents today today to lipid clinic accompained by his wife. His wife reports that patient's leg cramps at night can be severe. He has never taken a drug holiday. He does have chronic back issues.   Current Medications: rosuvastatin '5mg'$  three times a week and ezetimibe '5mg'$  daily Intolerances: simvastatin (joint pain), rosuvastatin '20mg'$  daily (joint pain), lovastatin (not effective) Risk Factors: CAD, HTN, PAD LDL goal: <70  Diet: not discussed today  Exercise:  not discussed today  Family History: The patient's family history includes Heart attack in his father; Heart disease in his father and mother.  Social History:  Social History   Socioeconomic History   Marital status: Married    Spouse name: Not on file   Number of children: Not on file   Years of education: Not on file   Highest education level: Not on file  Occupational History   Not on file  Tobacco Use   Smoking status: Never   Smokeless tobacco: Never  Substance and Sexual Activity   Alcohol use: No   Drug use: No   Sexual activity: Not on file  Other Topics Concern   Not on file  Social History Narrative   Not on file   Social Determinants of Health   Financial Resource  Strain: Not on file  Food Insecurity: Not on file  Transportation Needs: Not on file  Physical Activity: Not on file  Stress: Not on file  Social Connections: Not on file  Intimate Partner Violence: Not on file     Labs:  12/17/21 TC 140, TG 104, HDL 53, LDL-C 68 ((rosuvastatin '5mg'$  three times a week and ezetimibe '5mg'$  daily) 12/14/20 TC 144, TG 96, HDL 53, LDL-C 73 (rosuvastatin '5mg'$  three times a week and ezetimibe '5mg'$  daily)  Past Medical History:  Diagnosis Date   Actinic keratosis    Arthritis    Basal cell carcinoma 11/03/2008   Left med. clavicle   Basal cell carcinoma 09/28/2009   Left anterior neck   Basal cell carcinoma 09/28/2009   Left medial clavicle. Excised: 01/18/2010   Basal cell carcinoma 01/18/2010   Right post. top of shoulder   Basal cell carcinoma 01/18/2010   Left med. clavicle. Excised: 03/03/2010   Basal cell carcinoma 07/09/2013   Left postauricular   Basal cell carcinoma 07/09/2013   left lateral neck   Basal cell carcinoma of face 07/02/2014   Nodular pattern. Left temple   Basal cell carcinoma of face 05/17/2017   Nodular pattern. Right Zygoma. Tx: EDC   Basal cell carcinoma  of trunk 01/18/2010   Superficial. Left mid to upper back, ~5.0cm lat. to spine.   Basal cell carcinoma of trunk 01/18/2010   Right mid to upper back, 4.0cm lat. to spine   Basal cell carcinoma of trunk 03/04/2015   Superficial. RIght low back lateral.   Basal cell carcinoma of trunk 02/10/2016   Superficial. Right upper back   Basal cell carcinoma of trunk 02/10/2016   Superficial. Left upper back.   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 pretibial just below knee   BCC (basal cell carcinoma of skin) 08/09/2021   Upper back spinal, EDC   Cancer ([place])    akin cancer areas removed   Coronary artery disease    Diverticulitis    DJD (degenerative joint disease)    bulging disc upper and lower back   Elevated cholesterol    GERD (gastroesophageal reflux  disease)    Hypertension    Squamous cell carcinoma of face 09/28/2009   SCCis. Left forehead.   Squamous cell carcinoma of skin 11/03/2008   Well differentiated SCC. Left cheek/mid mandible. Excised: 12/10/2008   Squamous cell carcinoma of skin 10/21/2019   right lat neck/infra auricular   Squamous cell carcinoma of trunk 07/04/2018   Well differentiated SCC with superficial infiltration. Left suprasternal notch. Tx: EDC.    Current Outpatient Medications on File Prior to Visit  Medication Sig Dispense Refill   amLODipine-benazepril (LOTREL) 5-20 MG per capsule Take 1 capsule by mouth daily.      aspirin EC 81 MG tablet Take 81 mg by mouth daily.      cetirizine (ZYRTEC) 10 MG tablet Take 10 mg by mouth daily as needed for allergies.     clonazePAM (KLONOPIN) 0.5 MG tablet Take 0.5 mg by mouth daily as needed.     colchicine 0.6 MG tablet Take 1 tablet by mouth as needed.     docusate sodium (COLACE) 100 MG capsule Take 100 mg by mouth as needed.     ezetimibe (ZETIA) 10 MG tablet TAKE 1/2 TABLET BY MOUTH DAILY 15 tablet 0   fluticasone (FLONASE) 50 MCG/ACT nasal spray Place 1 spray into both nostrils daily.      Omega-3 Fatty Acids (FISH OIL) 1200 MG CPDR Take 1,200 mg by mouth daily.     omeprazole (PRILOSEC) 40 MG capsule Take 40 mg by mouth every morning.     rosuvastatin (CRESTOR) 5 MG tablet TAKE 1 TABLET (5 MG TOTAL) BY MOUTH EVERY MONDAY, WEDNESDAY, AND FRIDAY. 36 tablet 0   No current facility-administered medications on file prior to visit.    Allergies  Allergen Reactions   Ciprofloxacin     Other reaction(s): rash   Clarithromycin     Other reaction(s): rash   Codeine Other (See Comments)    Took to much about 20 years ago, made dizzy   Indomethacin     Other reaction(s): dizzy   Penicillins Other (See Comments)    Childhood allergy Has patient had a PCN reaction causing immediate rash, facial/tongue/throat swelling, SOB or lightheadedness with hypotension:  unknown Has patient had a PCN reaction causing severe rash involving mucus membranes or skin necrosis: unknown Has patient had a PCN reaction that required hospitalization unknown Has patient had a PCN reaction occurring within the last 10 years: unknown If all of the above answers are "NO", then may proceed with Cephalosporin use.    Uroxatral [Alfuzosin]     Other reaction(s): dry mouth   Zocor [Simvastatin] Other (  See Comments)    Assessment/Plan:  1. Hyperlipidemia - LDL-C is at goal of <70, but patient is not tolerating current medications. We discussed alternative medications including PCSK9i and Nexletol and Nexlizet. Discussed side effects, efficacy, cardiovascular outcomes data and cost. Explained to patient and wife the medicare coverage gap and how this could affect him. Patient would like to try PCSK9i. His insurance covers [employer]. [place] submitted and approved. Rx sent to pharmacy. Stop rosuvastatin and ezetimibe. I have encouraged patient to take a 1-2 week drug holiday from rosuvastatin and ezetimibe before starting Praluent. Recheck labs in 3 months.   Thank you,   Ramond Dial, Pharm.D, BCPS, CPP [place]  [address], [place], [place] [address]  Phone: [phone]; Fax: [phone]

## 2022-01-27 NOTE — Patient Instructions (Addendum)
Stop taking rosuvastatin and ezetimibe I will submit a prior authorization for Praluent- I will call you once its approved. Please call me at [phone] with any questions

## 2022-02-05 ENCOUNTER — Other Ambulatory Visit: Payer: Self-pay | Admitting: Interventional Cardiology

## 2022-02-08 MED ORDER — EZETIMIBE 10 MG PO TABS: 5.0000 mg | ORAL_TABLET | Freq: Every day | ORAL | 3 refills | Status: DC

## 2022-02-08 MED ORDER — ROSUVASTATIN CALCIUM 5 MG PO TABS: 5.0000 mg | ORAL_TABLET | ORAL | 3 refills | Status: DC

## 2022-02-08 NOTE — Addendum Note (Signed)
Addended by: Marcelle Overlie D on: 02/08/2022 01:10 PM   Modules accepted: Orders

## 2022-02-08 NOTE — Telephone Encounter (Signed)
Patient's wife called and stated that patient patient doesn't feel any different off statin and zetia. Patient would like to go back on rosuvastatin '5mg'$  MWF and zetia '5mg'$  daily. LDL-C was at goal. Advised this is ok. Updated medlist.

## 2022-04-20 ENCOUNTER — Telehealth: Payer: Self-pay | Admitting: Interventional Cardiology

## 2022-04-20 NOTE — Telephone Encounter (Signed)
Wife called wanting to cancel the patient's blood work appointment on 10/2 because patient was to change his rosuvastatin (CRESTOR) 5 MG tablet and ezetimibe (ZETIA) 10 MG tablet to injectables and now they are not changing the medication format.  Wife would like a call back if the patient should still do the lab work.

## 2022-04-20 NOTE — Telephone Encounter (Addendum)
Lab appointment canceled.

## 2022-04-25 ENCOUNTER — Other Ambulatory Visit: Payer: Medicare Other

## 2022-04-27 ENCOUNTER — Ambulatory Visit (INDEPENDENT_AMBULATORY_CARE_PROVIDER_SITE_OTHER): Payer: Medicare Other | Admitting: Dermatology

## 2022-04-27 DIAGNOSIS — C44222 Squamous cell carcinoma of skin of right ear and external auricular canal: Secondary | ICD-10-CM

## 2022-04-27 DIAGNOSIS — I2510 Atherosclerotic heart disease of native coronary artery without angina pectoris: Secondary | ICD-10-CM

## 2022-04-27 DIAGNOSIS — L570 Actinic keratosis: Secondary | ICD-10-CM

## 2022-04-27 DIAGNOSIS — D485 Neoplasm of uncertain behavior of skin: Secondary | ICD-10-CM

## 2022-04-27 DIAGNOSIS — L578 Other skin changes due to chronic exposure to nonionizing radiation: Secondary | ICD-10-CM | POA: Diagnosis not present

## 2022-04-27 MED ORDER — MUPIROCIN 2 % EX OINT: 1.0000 | TOPICAL_OINTMENT | Freq: Every day | CUTANEOUS | 0 refills | Status: DC

## 2022-04-27 NOTE — Patient Instructions (Signed)
Wound Care Instructions  Cleanse wound gently with soap and water once a day then pat dry with clean gauze. Apply a thin coat of Petrolatum (petroleum jelly, "Vaseline") over the wound (unless you have an allergy to this). We recommend that you use a new, sterile tube of Vaseline. Do not pick or remove scabs. Do not remove the yellow or white "healing tissue" from the base of the wound.  Cover the wound with fresh, clean, nonstick gauze and secure with paper tape. You may use Band-Aids in place of gauze and tape if the wound is small enough, but would recommend trimming much of the tape off as there is often too much. Sometimes Band-Aids can irritate the skin.  You should call the office for your biopsy report after 1 week if you have not already been contacted.  If you experience any problems, such as abnormal amounts of bleeding, swelling, significant bruising, significant pain, or evidence of infection, please call the office immediately.  FOR ADULT SURGERY PATIENTS: If you need something for pain relief you may take 1 extra strength Tylenol (acetaminophen) AND 2 Ibuprofen (200mg each) together every 4 hours as needed for pain. (do not take these if you are allergic to them or if you have a reason you should not take them.) Typically, you may only need pain medication for 1 to 3 days.     Due to recent changes in healthcare laws, you may see results of your pathology and/or laboratory studies on MyChart before the doctors have had a chance to review them. We understand that in some cases there may be results that are confusing or concerning to you. Please understand that not all results are received at the same time and often the doctors may need to interpret multiple results in order to provide you with the best plan of care or course of treatment. Therefore, we ask that you please give us 2 business days to thoroughly review all your results before contacting the office for clarification. Should  we see a critical lab result, you will be contacted sooner.   If You Need Anything After Your Visit  If you have any questions or concerns for your doctor, please call our main line at [phone] and press option 4 to reach your doctor's medical assistant. If no one answers, please leave a voicemail as directed and we will return your call as soon as possible. Messages left after 4 pm will be answered the following business day.   You may also send us a message via MyChart. We typically respond to MyChart messages within 1-2 business days.  For prescription refills, please ask your pharmacy to contact our office. Our fax number is [phone].  If you have an urgent issue when the clinic is closed that cannot wait until the next business day, you can page your doctor at the number below.    Please note that while we do our best to be available for urgent issues outside of office hours, we are not available 24/7.   If you have an urgent issue and are unable to reach us, you may choose to seek medical care at your doctor's office, retail clinic, urgent care center, or emergency room.  If you have a medical emergency, please immediately call 911 or go to the emergency department.  Pager Numbers  - Dr. Kowalski: [phone]  - Dr. Moye: [phone]  - Dr. Stewart: [phone]  In the event of inclement weather, please call our main line at   [phone] for an update on the status of any delays or closures.  Dermatology Medication Tips: Please keep the boxes that topical medications come in in order to help keep track of the instructions about where and how to use these. Pharmacies typically print the medication instructions only on the boxes and not directly on the medication tubes.   If your medication is too expensive, please contact our office at [phone] option 4 or send us a message through MyChart.   We are unable to tell what your co-pay for medications will be in  advance as this is different depending on your insurance coverage. However, we may be able to find a substitute medication at lower cost or fill out paperwork to get insurance to cover a needed medication.   If a prior authorization is required to get your medication covered by your insurance company, please allow us 1-2 business days to complete this process.  Drug prices often vary depending on where the prescription is filled and some pharmacies may offer cheaper prices.  The website www.goodrx.com contains coupons for medications through different pharmacies. The prices here do not account for what the cost may be with help from insurance (it may be cheaper with your insurance), but the website can give you the price if you did not use any insurance.  - You can print the associated coupon and take it with your prescription to the pharmacy.  - You may also stop by our office during regular business hours and pick up a GoodRx coupon card.  - If you need your prescription sent electronically to a different pharmacy, notify our office through [place] MyChart or by phone at [phone] option 4.     Si Usted Necesita Algo Despus de Su Visita  Tambin puede enviarnos un mensaje a travs de MyChart. Por lo general respondemos a los mensajes de MyChart en el transcurso de 1 a 2 das hbiles.  Para renovar recetas, por favor pida a su farmacia que se ponga en contacto con nuestra oficina. Nuestro nmero de fax es el [phone].  Si tiene un asunto urgente cuando la clnica est cerrada y que no puede esperar hasta el siguiente da hbil, puede llamar/localizar a su doctor(a) al nmero que aparece a continuacin.   Por favor, tenga en cuenta que aunque hacemos todo lo posible para estar disponibles para asuntos urgentes fuera del horario de oficina, no estamos disponibles las 24 horas del da, los 7 das de la semana.   Si tiene un problema urgente y no puede comunicarse con nosotros, puede  optar por buscar atencin mdica  en el consultorio de su doctor(a), en una clnica privada, en un centro de atencin urgente o en una sala de emergencias.  Si tiene una emergencia mdica, por favor llame inmediatamente al 911 o vaya a la sala de emergencias.  Nmeros de bper  - Dr. Kowalski: [phone]  - Dra. Moye: [phone]  - Dra. Stewart: [phone]  En caso de inclemencias del tiempo, por favor llame a nuestra lnea principal al [phone] para una actualizacin sobre el estado de cualquier retraso o cierre.  Consejos para la medicacin en dermatologa: Por favor, guarde las cajas en las que vienen los medicamentos de uso tpico para ayudarle a seguir las instrucciones sobre dnde y cmo usarlos. Las farmacias generalmente imprimen las instrucciones del medicamento slo en las cajas y no directamente en los tubos del medicamento.   Si su medicamento es muy caro, por favor, pngase en contacto con   nuestra oficina llamando al [phone] y presione la opcin 4 o envenos un mensaje a travs de MyChart.   No podemos decirle cul ser su copago por los medicamentos por adelantado ya que esto es diferente dependiendo de la cobertura de su seguro. Sin embargo, es posible que podamos encontrar un medicamento sustituto a menor costo o llenar un formulario para que el seguro cubra el medicamento que se considera necesario.   Si se requiere una autorizacin previa para que su compaa de seguros cubra su medicamento, por favor permtanos de 1 a 2 das hbiles para completar este proceso.  Los precios de los medicamentos varan con frecuencia dependiendo del lugar de dnde se surte la receta y alguna farmacias pueden ofrecer precios ms baratos.  El sitio web www.goodrx.com tiene cupones para medicamentos de diferentes farmacias. Los precios aqu no tienen en cuenta lo que podra costar con la ayuda del seguro (puede ser ms barato con su seguro), pero el sitio web puede darle el  precio si no utiliz ningn seguro.  - Puede imprimir el cupn correspondiente y llevarlo con su receta a la farmacia.  - Tambin puede pasar por nuestra oficina durante el horario de atencin regular y recoger una tarjeta de cupones de GoodRx.  - Si necesita que su receta se enve electrnicamente a una farmacia diferente, informe a nuestra oficina a travs de MyChart de [place] o por telfono llamando al [phone] y presione la opcin 4.  

## 2022-04-27 NOTE — Progress Notes (Signed)
Follow-Up Visit   Subjective  Paul Mann is a 76 y.o. male who presents for the following: Other (Spot on right ear - came up less than 2 months ago is growing fast/). The patient has spots, moles and lesions to be evaluated, some may be new or changing and the patient has concerns that these could be cancer.  Accompanied by wife  The following portions of the chart were reviewed this encounter and updated as appropriate:   Tobacco  Allergies  Meds  Problems  Med Hx  Surg Hx  Fam Hx     Review of Systems:  No other skin or systemic complaints except as noted in HPI or Assessment and Plan.  Objective  Well appearing patient in no apparent distress; mood and affect are within normal limits.  A focused examination was performed including face, right ear. Relevant physical exam findings are noted in the Assessment and Plan.  Right Ear 1.7 cm hyperkertotic papule  Forehead (4) Erythematous thin papules/macules with gritty scale.    Assessment & Plan   Neoplasm of uncertain behavior of skin Right Ear  Skin excision  Lesion length (cm):  1.7 Lesion width (cm):  1.7 Margin per side (cm):  0.2 Total excision diameter (cm):  2.1 Informed consent: discussed and consent obtained   Timeout: patient name, date of birth, surgical site, and procedure verified   Procedure prep:  Patient was prepped and draped in usual sterile fashion Prep type:  Isopropyl alcohol and povidone-iodine Anesthesia: the lesion was anesthetized in a standard fashion   Anesthetic:  1% lidocaine w/ epinephrine 1-100,000 buffered w/ 8.4% NaHCO3 Instrument used: #15 blade   Hemostasis achieved with: pressure   Hemostasis achieved with comment:  Electrocautery Outcome: patient tolerated procedure well with no complications   Post-procedure details: sterile dressing applied and wound care instructions given   Dressing type: bandage and pressure dressing (mupirocin)    Destruction of  lesion Complexity: extensive   Destruction method: electrodesiccation and curettage   Informed consent: discussed and consent obtained   Timeout:  patient name, date of birth, surgical site, and procedure verified Procedure prep:  Patient was prepped and draped in usual sterile fashion Prep type:  Isopropyl alcohol Anesthesia: the lesion was anesthetized in a standard fashion   Anesthetic:  1% lidocaine w/ epinephrine 1-100,000 buffered w/ 8.4% NaHCO3 Curettage performed in three different directions: Yes   Electrodesiccation performed over the curetted area: Yes   Lesion length (cm):  1.7 Lesion width (cm):  1.7 Margin per side (cm):  0.2 Final wound size (cm):  2.1 Hemostasis achieved with:  pressure and aluminum chloride Outcome: patient tolerated procedure well with no complications   Post-procedure details: sterile dressing applied and wound care instructions given   Dressing type: bandage and petrolatum    mupirocin ointment (BACTROBAN) 2 % Apply 1 Application topically daily. With dressing changes  Specimen 1 - Surgical pathology Differential Diagnosis: SCC vs other  Check Margins: No EDC today  Mupirocin ointment qd with dressing change  AK (actinic keratosis) (4) Forehead  Destruction of lesion - Forehead Complexity: simple   Destruction method: cryotherapy   Informed consent: discussed and consent obtained   Timeout:  patient name, date of birth, surgical site, and procedure verified Lesion destroyed using liquid nitrogen: Yes   Region frozen until ice ball extended beyond lesion: Yes   Outcome: patient tolerated procedure well with no complications   Post-procedure details: wound care instructions given    Actinic Damage -  chronic, secondary to cumulative UV radiation exposure/sun exposure over time - diffuse scaly erythematous macules with underlying dyspigmentation - Recommend daily broad spectrum sunscreen SPF 30+ to sun-exposed areas, reapply every 2 hours  as needed.  - Recommend staying in the shade or wearing long sleeves, sun glasses (UVA+UVB protection) and wide brim hats (4-inch brim around the entire circumference of the hat). - Call for new or changing lesions.  Return for Follow up as scheduled.  I, Ashok Cordia, CMA, am acting as scribe for Sarina Ser, MD . Documentation: I have reviewed the above documentation for accuracy and completeness, and I agree with the above.  Sarina Ser, MD

## 2022-04-29 ENCOUNTER — Encounter: Payer: Self-pay | Admitting: Dermatology

## 2022-05-03 ENCOUNTER — Telehealth: Payer: Self-pay

## 2022-05-03 NOTE — Telephone Encounter (Signed)
-----   Message from Ralene Bathe, MD sent at 04/29/2022 11:19 AM EDT ----- Diagnosis Skin (M), right ear WELL DIFFERENTIATED SQUAMOUS CELL CARCINOMA, ULCERATED  Cancer - Scc Already treated Recheck next visit

## 2022-05-03 NOTE — Telephone Encounter (Signed)
Advised patient's wife, Paul Mann, pt biopsy of the right ear was SCC and has already been treated with EDC. Will recheck at next appt as scheduled.

## 2022-05-16 ENCOUNTER — Ambulatory Visit (INDEPENDENT_AMBULATORY_CARE_PROVIDER_SITE_OTHER): Payer: Medicare Other | Admitting: Dermatology

## 2022-05-16 ENCOUNTER — Ambulatory Visit: Payer: Medicare Other | Admitting: Dermatology

## 2022-05-16 DIAGNOSIS — L570 Actinic keratosis: Secondary | ICD-10-CM

## 2022-05-16 DIAGNOSIS — L578 Other skin changes due to chronic exposure to nonionizing radiation: Secondary | ICD-10-CM

## 2022-05-16 DIAGNOSIS — I2510 Atherosclerotic heart disease of native coronary artery without angina pectoris: Secondary | ICD-10-CM

## 2022-05-16 DIAGNOSIS — C44320 Squamous cell carcinoma of skin of unspecified parts of face: Secondary | ICD-10-CM

## 2022-05-16 DIAGNOSIS — Z5111 Encounter for antineoplastic chemotherapy: Secondary | ICD-10-CM

## 2022-05-16 DIAGNOSIS — Z79899 Other long term (current) drug therapy: Secondary | ICD-10-CM

## 2022-05-16 DIAGNOSIS — C44329 Squamous cell carcinoma of skin of other parts of face: Secondary | ICD-10-CM | POA: Diagnosis not present

## 2022-05-16 DIAGNOSIS — D489 Neoplasm of uncertain behavior, unspecified: Secondary | ICD-10-CM

## 2022-05-16 NOTE — Progress Notes (Signed)
Follow-Up Visit   Subjective  Paul Mann is a 76 y.o. male who presents for the following: Actinic Keratosis (Ak follow up). The patient has spots, moles and lesions to be evaluated, some may be new or changing and the patient has concerns that these could be cancer.  The following portions of the chart were reviewed this encounter and updated as appropriate:  Tobacco  Allergies  Meds  Problems  Med Hx  Surg Hx  Fam Hx     Review of Systems: No other skin or systemic complaints except as noted in HPI or Assessment and Plan.  Objective  Well appearing patient in no apparent distress; mood and affect are within normal limits.  A focused examination was performed including upper extremities, including the arms, hands, fingers, and fingernails and head, including the scalp, face, neck, nose, ears, eyelids, and lips. Relevant physical exam findings are noted in the Assessment and Plan.  lt infraoribital area x 4 (4) Erythematous thin papules/macules with gritty scale.   left cheek 1.2 cm pink colored papule      Right Forehead 1.2 cm pink  colored papule         Assessment & Plan  Actinic keratosis (4) lt infraoribital area x 4 Recommend restarting fluorouracil /calcipotriene cream to right forehead bid for 7 days.  [facility]ream [facility]   Actinic keratoses are precancerous spots that appear secondary to cumulative UV radiation exposure/sun exposure over time. They are chronic with expected duration over 1 year. A portion of actinic keratoses will progress to squamous cell carcinoma of the skin. It is not possible to reliably predict which spots will progress to skin cancer and so treatment is recommended to prevent development of skin cancer.  Recommend daily broad spectrum sunscreen SPF 30+ to sun-exposed areas, reapply every 2 hours as needed.  Recommend staying in the shade or wearing long sleeves, sun glasses (UVA+UVB protection) and wide brim hats  (4-inch brim around the entire circumference of the hat). Call for new or changing lesions.  Neoplasm of uncertain behavior (2) left cheek Epidermal / dermal shaving Lesion diameter (cm):  1.2 Informed consent: discussed and consent obtained   Timeout: patient name, date of birth, surgical site, and procedure verified   Procedure prep:  Patient was prepped and draped in usual sterile fashion Prep type:  Isopropyl alcohol Anesthesia: the lesion was anesthetized in a standard fashion   Anesthetic:  1% lidocaine w/ epinephrine 1-100,000 buffered w/ 8.4% NaHCO3 Instrument used: flexible razor blade   Hemostasis achieved with: pressure, aluminum chloride and electrodesiccation   Outcome: patient tolerated procedure well   Post-procedure details: sterile dressing applied and wound care instructions given   Dressing type: bandage and petrolatum    Destruction of lesion Complexity: extensive   Destruction method: electrodesiccation and curettage   Informed consent: discussed and consent obtained   Timeout:  patient name, date of birth, surgical site, and procedure verified Procedure prep:  Patient was prepped and draped in usual sterile fashion Prep type:  Isopropyl alcohol Anesthesia: the lesion was anesthetized in a standard fashion   Anesthetic:  1% lidocaine w/ epinephrine 1-100,000 buffered w/ 8.4% NaHCO3 Curettage performed in three different directions: Yes   Electrodesiccation performed over the curetted area: Yes   Lesion length (cm):  1.2 Lesion width (cm):  1.2 Margin per side (cm):  0.2 Final wound size (cm):  1.6 Hemostasis achieved with:  pressure, aluminum chloride and electrodesiccation Outcome: patient tolerated procedure well with  no complications   Post-procedure details: sterile dressing applied and wound care instructions given   Dressing type: bandage and petrolatum    Specimen 1 - Surgical pathology Differential Diagnosis: r/o bcc Check Margins: No  Right  Forehead Epidermal / dermal shaving  Lesion diameter (cm):  1.2 Informed consent: discussed and consent obtained   Timeout: patient name, date of birth, surgical site, and procedure verified   Procedure prep:  Patient was prepped and draped in usual sterile fashion Prep type:  Isopropyl alcohol Anesthesia: the lesion was anesthetized in a standard fashion   Anesthetic:  1% lidocaine w/ epinephrine 1-100,000 buffered w/ 8.4% NaHCO3 Instrument used: flexible razor blade   Hemostasis achieved with: pressure, aluminum chloride and electrodesiccation   Outcome: patient tolerated procedure well   Post-procedure details: sterile dressing applied and wound care instructions given   Dressing type: bandage and petrolatum    Destruction of lesion Complexity: extensive   Destruction method: electrodesiccation and curettage   Informed consent: discussed and consent obtained   Timeout:  patient name, date of birth, surgical site, and procedure verified Procedure prep:  Patient was prepped and draped in usual sterile fashion Prep type:  Isopropyl alcohol Anesthesia: the lesion was anesthetized in a standard fashion   Anesthetic:  1% lidocaine w/ epinephrine 1-100,000 buffered w/ 8.4% NaHCO3 Curettage performed in three different directions: Yes   Electrodesiccation performed over the curetted area: Yes   Lesion length (cm):  1.2 Lesion width (cm):  1.2 Margin per side (cm):  0.2 Final wound size (cm):  1.6 Hemostasis achieved with:  pressure, aluminum chloride and electrodesiccation Outcome: patient tolerated procedure well with no complications   Post-procedure details: sterile dressing applied and wound care instructions given   Dressing type: bandage and petrolatum    Specimen 2 - Surgical pathology Differential Diagnosis: r/o bcc Check Margins: No R/o Bcc  Actinic Damage - Severe, confluent actinic changes with pre-cancerous actinic keratoses  - Severe, chronic, not at goal, secondary  to cumulative UV radiation exposure over time - diffuse scaly erythematous macules and papules with underlying dyspigmentation - Discussed Prescription "Field Treatment" for Severe, Chronic Confluent Actinic Changes with Pre-Cancerous Actinic Keratoses Field treatment involves treatment of an entire area of skin that has confluent Actinic Changes (Sun/ Ultraviolet light damage) and PreCancerous Actinic Keratoses by method of PhotoDynamic Therapy (PDT) and/or prescription Topical Chemotherapy agents such as 5-fluorouracil, 5-fluorouracil/calcipotriene, and/or imiquimod.  The purpose is to decrease the number of clinically evident and subclinical PreCancerous lesions to prevent progression to development of skin cancer by chemically destroying early precancer changes that may or may not be visible.  It has been shown to reduce the risk of developing skin cancer in the treated area. As a result of treatment, redness, scaling, crusting, and open sores may occur during treatment course. One or more than one of these methods may be used and may have to be used several times to control, suppress and eliminate the PreCancerous changes. Discussed treatment course, expected reaction, and possible side effects. - Recommend daily broad spectrum sunscreen SPF 30+ to sun-exposed areas, reapply every 2 hours as needed.  - Staying in the shade or wearing long sleeves, sun glasses (UVA+UVB protection) and wide brim hats (4-inch brim around the entire circumference of the hat) are also recommended. - Call for new or changing lesions.  - ReStart 5-fluorouracil/calcipotriene cream twice a day for 7 days to affected areas including right forehead. Prescription sent to [facility]. Patient advised they will  receive an email to purchase the medication online and have it sent to their home. Patient provided with handout reviewing treatment course and side effects and advised to call or message [place] on MyChart  with any concerns.  Reviewed course of treatment and expected reaction.  Patient advised to expect inflammation and crusting and advised that erosions are possible.  Patient advised to be diligent with sun protection during and after treatment. Counseled to keep medication out of reach of children and pets.  Return for 4 month ak follow up. IRuthell Rummage, CMA, am acting as scribe for Sarina Ser, MD. Documentation: I have reviewed the above documentation for accuracy and completeness, and I agree with the above.  Sarina Ser, MD

## 2022-05-16 NOTE — Patient Instructions (Addendum)
Instructions for [facility] Medications  One or more of your medications was sent to the [facility] mail order compounding pharmacy. You will receive an email from them and can purchase the medicine through that link. It will then be mailed to your home at the address you confirmed. If for any reason you do not receive an email from them, please check your spam folder. If you still do not find the email, please let us know. [facility] phone number is [phone].     Restart 5 fluorouracil cream twice daily to right forehead for 1 week.    5-Fluorouracil/Calcipotriene Patient Education   Actinic keratoses are the dry, red scaly spots on the skin caused by sun damage. A portion of these spots can turn into skin cancer with time, and treating them can help prevent development of skin cancer.   Treatment of these spots requires removal of the defective skin cells. There are various ways to remove actinic keratoses, including freezing with liquid nitrogen, treatment with creams, or treatment with a blue light procedure in the office.   5-fluorouracil cream is a topical cream used to treat actinic keratoses. It works by interfering with the growth of abnormal fast-growing skin cells, such as actinic keratoses. These cells peel off and are replaced by healthy ones.   5-fluorouracil/calcipotriene is a combination of the 5-fluorouracil cream with a vitamin D analog cream called calcipotriene. The calcipotriene alone does not treat actinic keratoses. However, when it is combined with 5-fluorouracil, it helps the 5-fluorouracil treat the actinic keratoses much faster so that the same results can be achieved with a much shorter treatment time.  INSTRUCTIONS FOR 5-FLUOROURACIL/CALCIPOTRIENE CREAM:   5-fluorouracil/calcipotriene cream typically only needs to be used for 4-7 days. A thin layer should be applied twice a day to the treatment areas recommended by your physician.   If your  physician prescribed you separate tubes of 5-fluourouracil and calcipotriene, apply a thin layer of 5-fluorouracil followed by a thin layer of calcipotriene.   Avoid contact with your eyes, nostrils, and mouth. Do not use 5-fluorouracil/calcipotriene cream on infected or open wounds.   You will develop redness, irritation and some crusting at areas where you have pre-cancer damage/actinic keratoses. IF YOU DEVELOP PAIN, BLEEDING, OR SIGNIFICANT CRUSTING, STOP THE TREATMENT EARLY - you have already gotten a good response and the actinic keratoses should clear up well.  Wash your hands after applying 5-fluorouracil 5% cream on your skin.   A moisturizer or sunscreen with a minimum SPF 30 should be applied each morning.   Once you have finished the treatment, you can apply a thin layer of Vaseline twice a day to irritated areas to soothe and calm the areas more quickly. If you experience significant discomfort, contact your physician.  For some patients it is necessary to repeat the treatment for best results.  SIDE EFFECTS: When using 5-fluorouracil/calcipotriene cream, you may have mild irritation, such as redness, dryness, swelling, or a mild burning sensation. This usually resolves within 2 weeks. The more actinic keratoses you have, the more redness and inflammation you can expect during treatment. Eye irritation has been reported rarely. If this occurs, please let us know.  If you have any trouble using this cream, please call the office. If you have any other questions about this information, please do not hesitate to ask me before you leave the office.       Biopsy Wound Care Instructions  Leave the original bandage on for 24 hours if  possible.  If the bandage becomes soaked or soiled before that time, it is OK to remove it and examine the wound.  A small amount of post-operative bleeding is normal.  If excessive bleeding occurs, remove the bandage, place gauze over the site and apply  continuous pressure (no peeking) over the area for 30 minutes. If this does not work, please call our clinic as soon as possible or page your doctor if it is after hours.   Once a day, cleanse the wound with soap and water. It is fine to shower. If a thick crust develops you may use a Q-tip dipped into dilute hydrogen peroxide (mix 1:1 with water) to dissolve it.  Hydrogen peroxide can slow the healing process, so use it only as needed.    After washing, apply petroleum jelly (Vaseline) or an antibiotic ointment if your doctor prescribed one for you, followed by a bandage.    For best healing, the wound should be covered with a layer of ointment at all times. If you are not able to keep the area covered with a bandage to hold the ointment in place, this may mean re-applying the ointment several times a day.  Continue this wound care until the wound has healed and is no longer open.   Itching and mild discomfort is normal during the healing process. However, if you develop pain or severe itching, please call our office.   If you have any discomfort, you can take Tylenol (acetaminophen) or ibuprofen as directed on the bottle. (Please do not take these if you have an allergy to them or cannot take them for another reason).  Some redness, tenderness and white or yellow material in the wound is normal healing.  If the area becomes very sore and red, or develops a thick yellow-green material (pus), it may be infected; please notify us.    If you have stitches, return to clinic as directed to have the stitches removed. You will continue wound care for 2-3 days after the stitches are removed.   Wound healing continues for up to one year following surgery. It is not unusual to experience pain in the scar from time to time during the interval.  If the pain becomes severe or the scar thickens, you should notify the office.    A slight amount of redness in a scar is expected for the first six months.  After  six months, the redness will fade and the scar will soften and fade.  The color difference becomes less noticeable with time.  If there are any problems, return for a post-op surgery check at your earliest convenience.  To improve the appearance of the scar, you can use silicone scar gel, cream, or sheets (such as Mederma or Serica) every night for up to one year. These are available over the counter (without a prescription).  Please call our office at [phone] for any questions or concerns.   Electrodesiccation and Curettage ("Scrape and Burn") Wound Care Instructions  Leave the original bandage on for 24 hours if possible.  If the bandage becomes soaked or soiled before that time, it is OK to remove it and examine the wound.  A small amount of post-operative bleeding is normal.  If excessive bleeding occurs, remove the bandage, place gauze over the site and apply continuous pressure (no peeking) over the area for 30 minutes. If this does not work, please call our clinic as soon as possible or page your doctor if it is after  hours.   Once a day, cleanse the wound with soap and water. It is fine to shower. If a thick crust develops you may use a Q-tip dipped into dilute hydrogen peroxide (mix 1:1 with water) to dissolve it.  Hydrogen peroxide can slow the healing process, so use it only as needed.    After washing, apply petroleum jelly (Vaseline) or an antibiotic ointment if your doctor prescribed one for you, followed by a bandage.    For best healing, the wound should be covered with a layer of ointment at all times. If you are not able to keep the area covered with a bandage to hold the ointment in place, this may mean re-applying the ointment several times a day.  Continue this wound care until the wound has healed and is no longer open. It may take several weeks for the wound to heal and close.  Itching and mild discomfort is normal during the healing process.  If you have any  discomfort, you can take Tylenol (acetaminophen) or ibuprofen as directed on the bottle. (Please do not take these if you have an allergy to them or cannot take them for another reason).  Some redness, tenderness and white or yellow material in the wound is normal healing.  If the area becomes very sore and red, or develops a thick yellow-green material (pus), it may be infected; please notify us.    Wound healing continues for up to one year following surgery. It is not unusual to experience pain in the scar from time to time during the interval.  If the pain becomes severe or the scar thickens, you should notify the office.    A slight amount of redness in a scar is expected for the first six months.  After six months, the redness will fade and the scar will soften and fade.  The color difference becomes less noticeable with time.  If there are any problems, return for a post-op surgery check at your earliest convenience.  To improve the appearance of the scar, you can use silicone scar gel, cream, or sheets (such as Mederma or Serica) every night for up to one year. These are available over the counter (without a prescription).  Please call our office at [phone] for any questions or concerns.      Actinic keratoses are precancerous spots that appear secondary to cumulative UV radiation exposure/sun exposure over time. They are chronic with expected duration over 1 year. A portion of actinic keratoses will progress to squamous cell carcinoma of the skin. It is not possible to reliably predict which spots will progress to skin cancer and so treatment is recommended to prevent development of skin cancer.  Recommend daily broad spectrum sunscreen SPF 30+ to sun-exposed areas, reapply every 2 hours as needed.  Recommend staying in the shade or wearing long sleeves, sun glasses (UVA+UVB protection) and wide brim hats (4-inch brim around the entire circumference of the hat). Call for new or  changing lesions.   Cryotherapy Aftercare  Wash gently with soap and water everyday.   Apply Vaseline and Band-Aid daily until healed.      Due to recent changes in healthcare laws, you may see results of your pathology and/or laboratory studies on MyChart before the doctors have had a chance to review them. We understand that in some cases there may be results that are confusing or concerning to you. Please understand that not all results are received at the same time and often the  doctors may need to interpret multiple results in order to provide you with the best plan of care or course of treatment. Therefore, we ask that you please give [place] 2 business days to thoroughly review all your results before contacting the office for clarification. Should we see a critical lab result, you will be contacted sooner.   If You Need Anything After Your Visit  If you have any questions or concerns for your doctor, please call our main line at [phone] and press option 4 to reach your doctor's medical assistant. If no one answers, please leave a voicemail as directed and we will return your call as soon as possible. Messages left after 4 pm will be answered the following business day.   You may also send [place] a message via [place]. We typically respond to MyChart messages within 1-2 business days.  For prescription refills, please ask your pharmacy to contact our office. Our fax number is [phone].  If you have an urgent issue when the clinic is closed that cannot wait until the next business day, you can page your doctor at the number below.    Please note that while we do our best to be available for urgent issues outside of office hours, we are not available 24/7.   If you have an urgent issue and are unable to reach [place], you may choose to seek medical care at your doctor's office, retail clinic, urgent care center, or emergency room.  If you have a medical emergency, please immediately call  911 or go to the emergency department.  Pager Numbers  - Dr. Nehemiah Massed: [phone]  - Dr. Laurence Ferrari: [phone]  - Dr. Nicole Kindred: [phone]  In the event of inclement weather, please call our main line at [phone] for an update on the status of any delays or closures.  Dermatology Medication Tips: Please keep the boxes that topical medications come in in order to help keep track of the instructions about where and how to use these. Pharmacies typically print the medication instructions only on the boxes and not directly on the medication tubes.   If your medication is too expensive, please contact our office at [phone] option 4 or send [place] a message through [place].   We are unable to tell what your co-pay for medications will be in advance as this is different depending on your insurance coverage. However, we may be able to find a substitute medication at lower cost or fill out paperwork to get insurance to cover a needed medication.   If a prior authorization is required to get your medication covered by your insurance company, please allow [place] 1-2 business days to complete this process.  Drug prices often vary depending on where the prescription is filled and some pharmacies may offer cheaper prices.  The website www.goodrx.com contains coupons for medications through different pharmacies. The prices here do not account for what the cost may be with help from insurance (it may be cheaper with your insurance), but the website can give you the price if you did not use any insurance.  - You can print the associated coupon and take it with your prescription to the pharmacy.  - You may also stop by our office during regular business hours and pick up a GoodRx coupon card.  - If you need your prescription sent electronically to a different pharmacy, notify our office through [facility] or by phone at [phone] option 4.     Si Usted Necesita Algo Despus  de Su  Visita  Tambin puede enviarnos un mensaje a travs de Pharmacist, community. Por lo general respondemos a los mensajes de MyChart en el transcurso de 1 a 2 das hbiles.  Para renovar recetas, por favor pida a su farmacia que se ponga en contacto con nuestra oficina. Harland Dingwall de fax es [place] [phone].  Si tiene un asunto urgente cuando la clnica est cerrada y que no puede esperar hasta el siguiente da hbil, puede llamar/localizar a su doctor(a) al nmero que aparece a continuacin.   Por favor, tenga en cuenta que aunque hacemos todo lo posible para estar disponibles para asuntos urgentes fuera del horario de [place], no estamos disponibles las 24 horas del da, los 7 das de la [place].   Si tiene un problema urgente y no puede comunicarse con nosotros, puede optar por buscar atencin mdica  en el consultorio de su doctor(a), en una clnica privada, en un centro de atencin urgente o en una sala de emergencias.  Si tiene Engineering geologist, por favor llame inmediatamente al 911 o vaya a la sala de emergencias.  Nmeros de bper  - Dr. Nehemiah Massed: [phone]  - Dra. Moye: [phone]  - Dra. Nicole Kindred: [phone]  En caso de inclemencias del [place], por favor llame a Johnsie Kindred principal al [phone] para una actualizacin sobre el [place] de cualquier retraso o cierre.  Consejos para la medicacin en dermatologa: Por favor, guarde las cajas en las que vienen los medicamentos de uso tpico para ayudarle a seguir las instrucciones sobre dnde y cmo usarlos. Las farmacias generalmente imprimen las instrucciones del medicamento slo en las cajas y no directamente en los tubos del [place].   Si su medicamento es muy caro, por favor, pngase en contacto con Zigmund Daniel llamando al [phone] y presione la opcin 4 o envenos un mensaje a travs de Pharmacist, community.   No podemos decirle cul ser su copago por los medicamentos por adelantado ya que esto es diferente dependiendo de la  cobertura de su seguro. Sin embargo, es posible que podamos encontrar un medicamento sustituto a Electrical engineer un formulario para que el seguro cubra el medicamento que se considera necesario.   Si se requiere una autorizacin previa para que su compaa de seguros [place] su medicamento, por favor permtanos de 1 a 2 das hbiles para completar este proceso.  Los precios de los medicamentos varan con frecuencia dependiendo del Environmental consultant de dnde se surte la receta y alguna farmacias pueden ofrecer precios ms baratos.  El sitio web www.goodrx.com tiene cupones para medicamentos de Airline pilot. Los precios aqu no tienen en cuenta lo que podra costar con la ayuda del seguro (puede ser ms barato con su seguro), pero el sitio web puede darle el precio si no utiliz Research scientist (physical sciences).  - Puede imprimir el cupn correspondiente y llevarlo con su receta a la farmacia.  - Tambin puede pasar por nuestra oficina durante el horario de atencin regular y Charity fundraiser una tarjeta de cupones de GoodRx.  - Si necesita que su receta se enve electrnicamente a una farmacia diferente, informe a nuestra oficina a travs de MyChart de [place] o por telfono llamando al [phone] y presione la opcin 4.

## 2022-05-22 ENCOUNTER — Encounter: Payer: Self-pay | Admitting: Dermatology

## 2022-05-24 ENCOUNTER — Telehealth: Payer: Self-pay

## 2022-05-24 NOTE — Telephone Encounter (Signed)
-----   Message from Ralene Bathe, MD sent at 05/19/2022  5:54 PM EDT ----- Diagnosis 1. 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 Skin , right forehead WELL DIFFERENTIATED SQUAMOUS CELL CARCINOMA WITH SUPERFICIAL INFILTRATION, SEE DESCRIPTION  !&2 - both cancer = SCC Both already treated Recheck next visit

## 2022-05-24 NOTE — Telephone Encounter (Signed)
Discussed biopsy results with patient wife   .

## 2022-06-28 DIAGNOSIS — N182 Chronic kidney disease, stage 2 (mild): Secondary | ICD-10-CM | POA: Diagnosis not present

## 2022-06-28 DIAGNOSIS — Z23 Encounter for immunization: Secondary | ICD-10-CM | POA: Diagnosis not present

## 2022-06-28 DIAGNOSIS — G720 Drug-induced myopathy: Secondary | ICD-10-CM | POA: Diagnosis not present

## 2022-06-28 DIAGNOSIS — K219 Gastro-esophageal reflux disease without esophagitis: Secondary | ICD-10-CM | POA: Diagnosis not present

## 2022-06-28 DIAGNOSIS — M109 Gout, unspecified: Secondary | ICD-10-CM | POA: Diagnosis not present

## 2022-06-28 DIAGNOSIS — M519 Unspecified thoracic, thoracolumbar and lumbosacral intervertebral disc disorder: Secondary | ICD-10-CM | POA: Diagnosis not present

## 2022-06-28 DIAGNOSIS — I10 Essential (primary) hypertension: Secondary | ICD-10-CM | POA: Diagnosis not present

## 2022-06-28 DIAGNOSIS — N400 Enlarged prostate without lower urinary tract symptoms: Secondary | ICD-10-CM | POA: Diagnosis not present

## 2022-06-28 DIAGNOSIS — E782 Mixed hyperlipidemia: Secondary | ICD-10-CM | POA: Diagnosis not present

## 2022-07-25 DIAGNOSIS — C439 Malignant melanoma of skin, unspecified: Secondary | ICD-10-CM

## 2022-07-25 HISTORY — DX: Malignant melanoma of skin, unspecified: C43.9

## 2022-09-08 ENCOUNTER — Ambulatory Visit: Payer: Medicare Other | Admitting: Dermatology

## 2022-09-22 ENCOUNTER — Ambulatory Visit (INDEPENDENT_AMBULATORY_CARE_PROVIDER_SITE_OTHER): Payer: Medicare Other | Admitting: Dermatology

## 2022-09-22 VITALS — BP 116/60 | HR 53

## 2022-09-22 DIAGNOSIS — C44311 Basal cell carcinoma of skin of nose: Secondary | ICD-10-CM | POA: Diagnosis not present

## 2022-09-22 DIAGNOSIS — C4441 Basal cell carcinoma of skin of scalp and neck: Secondary | ICD-10-CM

## 2022-09-22 DIAGNOSIS — L570 Actinic keratosis: Secondary | ICD-10-CM

## 2022-09-22 DIAGNOSIS — D492 Neoplasm of unspecified behavior of bone, soft tissue, and skin: Secondary | ICD-10-CM

## 2022-09-22 DIAGNOSIS — L578 Other skin changes due to chronic exposure to nonionizing radiation: Secondary | ICD-10-CM | POA: Diagnosis not present

## 2022-09-22 NOTE — Patient Instructions (Addendum)
Wound Care Instructions  Cleanse wound gently with soap and water once a day then pat dry with clean gauze. Apply a thin coat of Petrolatum (petroleum jelly, "Vaseline") over the wound (unless you have an allergy to this). We recommend that you use a new, sterile tube of Vaseline. Do not pick or remove scabs. Do not remove the yellow or white "healing tissue" from the base of the wound.  Cover the wound with fresh, clean, nonstick gauze and secure with paper tape. You may use Band-Aids in place of gauze and tape if the wound is small enough, but would recommend trimming much of the tape off as there is often too much. Sometimes Band-Aids can irritate the skin.  You should call the office for your biopsy report after 1 week if you have not already been contacted.  If you experience any problems, such as abnormal amounts of bleeding, swelling, significant bruising, significant pain, or evidence of infection, please call the office immediately.  FOR ADULT SURGERY PATIENTS: If you need something for pain relief you may take 1 extra strength Tylenol (acetaminophen) AND 2 Ibuprofen (200mg each) together every 4 hours as needed for pain. (do not take these if you are allergic to them or if you have a reason you should not take them.) Typically, you may only need pain medication for 1 to 3 days.     Cryotherapy Aftercare  Wash gently with soap and water everyday.   Apply Vaseline and Band-Aid daily until healed.    Due to recent changes in healthcare laws, you may see results of your pathology and/or laboratory studies on MyChart before the doctors have had a chance to review them. We understand that in some cases there may be results that are confusing or concerning to you. Please understand that not all results are received at the same time and often the doctors may need to interpret multiple results in order to provide you with the best plan of care or course of treatment. Therefore, we ask that you  please give us 2 business days to thoroughly review all your results before contacting the office for clarification. Should we see a critical lab result, you will be contacted sooner.   If You Need Anything After Your Visit  If you have any questions or concerns for your doctor, please call our main line at [phone] and press option 4 to reach your doctor's medical assistant. If no one answers, please leave a voicemail as directed and we will return your call as soon as possible. Messages left after 4 pm will be answered the following business day.   You may also send us a message via MyChart. We typically respond to MyChart messages within 1-2 business days.  For prescription refills, please ask your pharmacy to contact our office. Our fax number is [phone].  If you have an urgent issue when the clinic is closed that cannot wait until the next business day, you can page your doctor at the number below.    Please note that while we do our best to be available for urgent issues outside of office hours, we are not available 24/7.   If you have an urgent issue and are unable to reach us, you may choose to seek medical care at your doctor's office, retail clinic, urgent care center, or emergency room.  If you have a medical emergency, please immediately call 911 or go to the emergency department.  Pager Numbers  - Dr. Kowalski: [phone]  -   Dr. Moye: [phone]  - Dr. Stewart: [phone]  In the event of inclement weather, please call our main line at [phone] for an update on the status of any delays or closures.  Dermatology Medication Tips: Please keep the boxes that topical medications come in in order to help keep track of the instructions about where and how to use these. Pharmacies typically print the medication instructions only on the boxes and not directly on the medication tubes.   If your medication is too expensive, please contact our office at  [phone] option 4 or send us a message through MyChart.   We are unable to tell what your co-pay for medications will be in advance as this is different depending on your insurance coverage. However, we may be able to find a substitute medication at lower cost or fill out paperwork to get insurance to cover a needed medication.   If a prior authorization is required to get your medication covered by your insurance company, please allow us 1-2 business days to complete this process.  Drug prices often vary depending on where the prescription is filled and some pharmacies may offer cheaper prices.  The website www.goodrx.com contains coupons for medications through different pharmacies. The prices here do not account for what the cost may be with help from insurance (it may be cheaper with your insurance), but the website can give you the price if you did not use any insurance.  - You can print the associated coupon and take it with your prescription to the pharmacy.  - You may also stop by our office during regular business hours and pick up a GoodRx coupon card.  - If you need your prescription sent electronically to a different pharmacy, notify our office through [facility] MyChart or by phone at [phone] option 4.     Si Usted Necesita Algo Despus de Su Visita  Tambin puede enviarnos un mensaje a travs de MyChart. Por lo general respondemos a los mensajes de MyChart en el transcurso de 1 a 2 das hbiles.  Para renovar recetas, por favor pida a su farmacia que se ponga en contacto con nuestra oficina. Nuestro nmero de fax es el [phone].  Si tiene un asunto urgente cuando la clnica est cerrada y que no puede esperar hasta el siguiente da hbil, puede llamar/localizar a su doctor(a) al nmero que aparece a continuacin.   Por favor, tenga en cuenta que aunque hacemos todo lo posible para estar disponibles para asuntos urgentes fuera del horario de oficina, no estamos  disponibles las 24 horas del da, los 7 das de la semana.   Si tiene un problema urgente y no puede comunicarse con nosotros, puede optar por buscar atencin mdica  en el consultorio de su doctor(a), en una clnica privada, en un centro de atencin urgente o en una sala de emergencias.  Si tiene una emergencia mdica, por favor llame inmediatamente al 911 o vaya a la sala de emergencias.  Nmeros de bper  - Dr. Kowalski: [phone]  - Dra. Moye: [phone]  - Dra. Stewart: [phone]  En caso de inclemencias del tiempo, por favor llame a nuestra lnea principal al [phone] para una actualizacin sobre el estado de cualquier retraso o cierre.  Consejos para la medicacin en dermatologa: Por favor, guarde las cajas en las que vienen los medicamentos de uso tpico para ayudarle a seguir las instrucciones sobre dnde y cmo usarlos. Las farmacias generalmente imprimen las instrucciones del medicamento slo en las cajas y   no directamente en los tubos del medicamento.   Si su medicamento es muy caro, por favor, pngase en contacto con nuestra oficina llamando al [phone] y presione la opcin 4 o envenos un mensaje a travs de MyChart.   No podemos decirle cul ser su copago por los medicamentos por adelantado ya que esto es diferente dependiendo de la cobertura de su seguro. Sin embargo, es posible que podamos encontrar un medicamento sustituto a menor costo o llenar un formulario para que el seguro cubra el medicamento que se considera necesario.   Si se requiere una autorizacin previa para que su compaa de seguros cubra su medicamento, por favor permtanos de 1 a 2 das hbiles para completar este proceso.  Los precios de los medicamentos varan con frecuencia dependiendo del lugar de dnde se surte la receta y alguna farmacias pueden ofrecer precios ms baratos.  El sitio web www.goodrx.com tiene cupones para medicamentos de diferentes farmacias. Los precios aqu no  tienen en cuenta lo que podra costar con la ayuda del seguro (puede ser ms barato con su seguro), pero el sitio web puede darle el precio si no utiliz ningn seguro.  - Puede imprimir el cupn correspondiente y llevarlo con su receta a la farmacia.  - Tambin puede pasar por nuestra oficina durante el horario de atencin regular y recoger una tarjeta de cupones de GoodRx.  - Si necesita que su receta se enve electrnicamente a una farmacia diferente, informe a nuestra oficina a travs de MyChart de [facility] o por telfono llamando al [phone] y presione la opcin 4.  

## 2022-09-22 NOTE — Progress Notes (Signed)
Follow-Up Visit   Subjective  Paul Mann is a 77 y.o. male who presents for the following: Actinic Keratosis (Face, 45mf/u, 5FU/Calcipotriene to R forehead with good reaction) and check spots (L cheek, recently got irritated, pt picks at/L neck, >683mno symptoms).  The following portions of the chart were reviewed this encounter and updated as appropriate:   Tobacco  Allergies  Meds  Problems  Med Hx  Surg Hx  Fam Hx     Review of Systems:  No other skin or systemic complaints except as noted in HPI or Assessment and Plan.  Objective  Well appearing patient in no apparent distress; mood and affect are within normal limits.  A focused examination was performed including face. Relevant physical exam findings are noted in the Assessment and Plan.  L post neck Firm pap 1.0cm       L nasal bridge 0.8cm crusted pap       face x 10 (10) Pink scaly macules   Assessment & Plan  Neoplasm of skin (2) L post neck See photo Skin excision  Lesion length (cm):  1 Lesion width (cm):  1 Margin per side (cm):  0.2 Total excision diameter (cm):  1.4 Informed consent: discussed and consent obtained   Timeout: patient name, date of birth, surgical site, and procedure verified   Procedure prep:  Patient was prepped and draped in usual sterile fashion Prep type:  Isopropyl alcohol and povidone-iodine Anesthesia: the lesion was anesthetized in a standard fashion   Anesthetic:  1% lidocaine w/ epinephrine 1-100,000 buffered w/ 8.4% NaHCO3 Instrument used: #15 blade   Hemostasis achieved with: pressure   Hemostasis achieved with comment:  Electrocautery Outcome: patient tolerated procedure well with no complications   Post-procedure details: sterile dressing applied and wound care instructions given   Dressing type: bandage, pressure dressing and bacitracin (Mupirocin)    Destruction of lesion Complexity: extensive   Destruction method: electrodesiccation and curettage    Informed consent: discussed and consent obtained   Timeout:  patient name, date of birth, surgical site, and procedure verified Procedure prep:  Patient was prepped and draped in usual sterile fashion Prep type:  Isopropyl alcohol Anesthesia: the lesion was anesthetized in a standard fashion   Anesthetic:  1% lidocaine w/ epinephrine 1-100,000 buffered w/ 8.4% NaHCO3 Curettage performed in three different directions: Yes   Electrodesiccation performed over the curetted area: Yes   Lesion length (cm):  1 Lesion width (cm):  1 Margin per side (cm):  0.2 Final wound size (cm):  1.4 Hemostasis achieved with:  pressure, aluminum chloride and electrodesiccation Outcome: patient tolerated procedure well with no complications   Post-procedure details: sterile dressing applied and wound care instructions given   Dressing type: bandage and bacitracin    Specimen 1 - Surgical pathology Differential Diagnosis: D48.5 Cyst r/o BCC vs SCC Check Margins: yes Firm pap 2 pieces Simple excision and EDC  L nasal bridge See photo  Skin / nail biopsy Type of biopsy: tangential   Informed consent: discussed and consent obtained   Timeout: patient name, date of birth, surgical site, and procedure verified   Procedure prep:  Patient was prepped and draped in usual sterile fashion Prep type:  Isopropyl alcohol Anesthesia: the lesion was anesthetized in a standard fashion   Anesthetic:  1% lidocaine w/ epinephrine 1-100,000 buffered w/ 8.4% NaHCO3 Instrument used: flexible razor blade   Outcome: patient tolerated procedure well   Post-procedure details: sterile dressing applied and wound care  instructions given   Dressing type: bandage and bacitracin    Specimen 2 - Surgical pathology Differential Diagnosis: D48.5 R/O BCC vs SCC Check Margins: No 0.8cm crusted pap  AK (actinic keratosis) (10) face x 10 Destruction of lesion - face x 10 Complexity: simple   Destruction method: cryotherapy    Informed consent: discussed and consent obtained   Timeout:  patient name, date of birth, surgical site, and procedure verified Lesion destroyed using liquid nitrogen: Yes   Region frozen until ice ball extended beyond lesion: Yes   Outcome: patient tolerated procedure well with no complications   Post-procedure details: wound care instructions given    Actinic Damage - chronic, secondary to cumulative UV radiation exposure/sun exposure over time - diffuse scaly erythematous macules with underlying dyspigmentation - Recommend daily broad spectrum sunscreen SPF 30+ to sun-exposed areas, reapply every 2 hours as needed.  - Recommend staying in the shade or wearing long sleeves, sun glasses (UVA+UVB protection) and wide brim hats (4-inch brim around the entire circumference of the hat). - Call for new or changing lesions.  Return in about 4 months (around 01/21/2023) for UBSE, Hx of BCC, Hx of SCC, Hx of AKs.  I, [name], RMA, am acting as scribe for Paul Ser, MD . Documentation: I have reviewed the above documentation for accuracy and completeness, and I agree with the above.  Paul Ser, MD

## 2022-09-26 ENCOUNTER — Telehealth: Payer: Self-pay

## 2022-09-26 DIAGNOSIS — C44311 Basal cell carcinoma of skin of nose: Secondary | ICD-10-CM

## 2022-09-26 NOTE — Telephone Encounter (Signed)
-----   Message from Ralene Bathe, MD sent at 09/26/2022  1:47 PM EST ----- Diagnosis 1. Skin , left post neck BASAL CELL CARCINOMA, NODULAR PATTERN, DEEP MARGIN INVOLVED 2. Skin , left nasal bridge BASAL CELL CARCINOMA, FRAGMENTS, MARGINS INVOLVED  1- Cancer - BCC Already treated Recheck next visit 2- Cancer - BCC Schedule for MOHS

## 2022-09-26 NOTE — Telephone Encounter (Signed)
Advised pt and wife of bx results.  They would prefer to have m[facility]s at [place].  Referral sent to [facility]./sh

## 2022-09-27 ENCOUNTER — Encounter: Payer: Self-pay | Admitting: Dermatology

## 2022-09-30 DIAGNOSIS — C44311 Basal cell carcinoma of skin of nose: Secondary | ICD-10-CM | POA: Diagnosis not present

## 2022-09-30 DIAGNOSIS — D485 Neoplasm of uncertain behavior of skin: Secondary | ICD-10-CM | POA: Diagnosis not present

## 2022-09-30 DIAGNOSIS — L989 Disorder of the skin and subcutaneous tissue, unspecified: Secondary | ICD-10-CM | POA: Diagnosis not present

## 2022-10-11 ENCOUNTER — Telehealth: Payer: Self-pay

## 2022-10-11 NOTE — Telephone Encounter (Signed)
Updated specimen tracking and history from [facility] progress notes. aw 

## 2022-11-03 DIAGNOSIS — Z85828 Personal history of other malignant neoplasm of skin: Secondary | ICD-10-CM | POA: Diagnosis not present

## 2022-11-03 DIAGNOSIS — D229 Melanocytic nevi, unspecified: Secondary | ICD-10-CM | POA: Diagnosis not present

## 2022-11-03 DIAGNOSIS — Z86006 Personal history of melanoma in-situ: Secondary | ICD-10-CM | POA: Diagnosis not present

## 2022-11-03 DIAGNOSIS — C4441 Basal cell carcinoma of skin of scalp and neck: Secondary | ICD-10-CM | POA: Diagnosis not present

## 2022-11-03 DIAGNOSIS — Z08 Encounter for follow-up examination after completed treatment for malignant neoplasm: Secondary | ICD-10-CM | POA: Diagnosis not present

## 2022-11-03 DIAGNOSIS — R229 Localized swelling, mass and lump, unspecified: Secondary | ICD-10-CM | POA: Diagnosis not present

## 2022-11-03 DIAGNOSIS — L821 Other seborrheic keratosis: Secondary | ICD-10-CM | POA: Diagnosis not present

## 2022-11-03 DIAGNOSIS — D485 Neoplasm of uncertain behavior of skin: Secondary | ICD-10-CM | POA: Diagnosis not present

## 2022-11-10 DIAGNOSIS — C4331 Malignant melanoma of nose: Secondary | ICD-10-CM | POA: Diagnosis not present

## 2022-11-10 DIAGNOSIS — L989 Disorder of the skin and subcutaneous tissue, unspecified: Secondary | ICD-10-CM | POA: Diagnosis not present

## 2022-12-26 DIAGNOSIS — C4441 Basal cell carcinoma of skin of scalp and neck: Secondary | ICD-10-CM | POA: Diagnosis not present

## 2023-01-12 ENCOUNTER — Ambulatory Visit: Payer: Medicare Other | Admitting: Dermatology

## 2023-01-23 DIAGNOSIS — C4441 Basal cell carcinoma of skin of scalp and neck: Secondary | ICD-10-CM | POA: Diagnosis not present

## 2023-01-30 ENCOUNTER — Other Ambulatory Visit: Payer: Self-pay

## 2023-01-30 MED ORDER — EZETIMIBE 10 MG PO TABS: 5.0000 mg | ORAL_TABLET | Freq: Every day | ORAL | 0 refills | Status: DC

## 2023-02-01 DIAGNOSIS — Z0000 Encounter for general adult medical examination without abnormal findings: Secondary | ICD-10-CM | POA: Diagnosis not present

## 2023-02-01 DIAGNOSIS — Z1331 Encounter for screening for depression: Secondary | ICD-10-CM | POA: Diagnosis not present

## 2023-02-01 DIAGNOSIS — G2581 Restless legs syndrome: Secondary | ICD-10-CM | POA: Diagnosis not present

## 2023-02-01 DIAGNOSIS — I10 Essential (primary) hypertension: Secondary | ICD-10-CM | POA: Diagnosis not present

## 2023-02-01 DIAGNOSIS — M519 Unspecified thoracic, thoracolumbar and lumbosacral intervertebral disc disorder: Secondary | ICD-10-CM | POA: Diagnosis not present

## 2023-02-01 DIAGNOSIS — N400 Enlarged prostate without lower urinary tract symptoms: Secondary | ICD-10-CM | POA: Diagnosis not present

## 2023-02-01 DIAGNOSIS — K219 Gastro-esophageal reflux disease without esophagitis: Secondary | ICD-10-CM | POA: Diagnosis not present

## 2023-02-01 DIAGNOSIS — N182 Chronic kidney disease, stage 2 (mild): Secondary | ICD-10-CM | POA: Diagnosis not present

## 2023-02-01 DIAGNOSIS — G720 Drug-induced myopathy: Secondary | ICD-10-CM | POA: Diagnosis not present

## 2023-02-01 DIAGNOSIS — R7303 Prediabetes: Secondary | ICD-10-CM | POA: Diagnosis not present

## 2023-02-01 DIAGNOSIS — I7091 Generalized atherosclerosis: Secondary | ICD-10-CM | POA: Diagnosis not present

## 2023-02-06 DIAGNOSIS — C44319 Basal cell carcinoma of skin of other parts of face: Secondary | ICD-10-CM | POA: Diagnosis not present

## 2023-02-07 ENCOUNTER — Other Ambulatory Visit: Payer: Self-pay | Admitting: *Deleted

## 2023-02-07 MED ORDER — ROSUVASTATIN CALCIUM 5 MG PO TABS: 5.0000 mg | ORAL_TABLET | ORAL | 0 refills | Status: DC

## 2023-03-07 DIAGNOSIS — Z4801 Encounter for change or removal of surgical wound dressing: Secondary | ICD-10-CM | POA: Diagnosis not present

## 2023-03-07 DIAGNOSIS — L928 Other granulomatous disorders of the skin and subcutaneous tissue: Secondary | ICD-10-CM | POA: Diagnosis not present

## 2023-04-11 DIAGNOSIS — L570 Actinic keratosis: Secondary | ICD-10-CM | POA: Diagnosis not present

## 2023-04-11 DIAGNOSIS — L578 Other skin changes due to chronic exposure to nonionizing radiation: Secondary | ICD-10-CM | POA: Diagnosis not present

## 2023-04-11 DIAGNOSIS — L905 Scar conditions and fibrosis of skin: Secondary | ICD-10-CM | POA: Diagnosis not present

## 2023-04-13 DIAGNOSIS — Z23 Encounter for immunization: Secondary | ICD-10-CM | POA: Diagnosis not present

## 2023-04-13 NOTE — Progress Notes (Signed)
Cardiology Office Note   Date:  04/14/2023   ID:  JRUE ORMES, DOB 1945-09-14, MRN [id number]  PCP:  Georgann Housekeeper, MD    No chief complaint on file.  CAD  Wt Readings from Last 3 Encounters:  04/14/23 176 lb (79.8 kg)  01/20/22 163 lb 12.8 oz (74.3 kg)  10/29/20 169 lb (76.7 kg)       History of Present Illness: Paul Mann is a 77 y.o. male  former patient of Dr. Katrinka Blazing with a hx of  non-obstructive CAD by cor  angiography 2002 (40% mid LAD and mid RCA), with recent DOE and chest tightness. COR CTA with abnormal mid-distal LAD FFR 08/17/2020 treated medically.     Coronary CTA showed: "Coronary CTA performed January 2022: IMPRESSION: 1. Coronary calcium score of 1283. This was 82nd percentile for age and sex matched control.   2. Normal coronary origin with right dominance.   3. There is diffuse, calcified plaque. There is severe (>70%) stenosis in the proximal and mid LAD and ostial LCX. There is moderate (50-69%) RCA plaque. Each region is heavily calcified with can lead to blooming artifact and over-estimation of disease severity. Will send for FFRct.   CT FFR 08/18/2020:   IMPRESSION: 1. FFRct findings are consistent with severe stenosis in the mid to distal LAD.   2.  FFRct findings in the mid LCX are indeterminate.   3. FFRct findings in the RCA are consistent with non-obstructive disease."   Plays golf regularly.  No anginal symptoms.  Denies : Chest pain. Dizziness. Leg edema. Nitroglycerin use. Orthopnea. Palpitations. Paroxysmal nocturnal dyspnea. Shortness of breath. Syncope.    Still has some leg pains but they are not related to exertion and did not change when he stopped his statin several years ago.  Past Medical History:  Diagnosis Date   Actinic keratosis    Arthritis    Basal cell carcinoma 11/03/2008   Left med. clavicle   Basal cell carcinoma 09/28/2009   Left anterior neck   Basal cell carcinoma 09/28/2009   Left medial  clavicle. Excised: 01/18/2010   Basal cell carcinoma 01/18/2010   Right post. top of shoulder   Basal cell carcinoma 01/18/2010   Left med. clavicle. Excised: 03/03/2010   Basal cell carcinoma 07/09/2013   Left postauricular   Basal cell carcinoma 07/09/2013   left lateral neck   Basal cell carcinoma 09/22/2022   L nasal bridge, [facility] 09/30/22   Basal cell carcinoma 09/22/2022   L post neck, EDC   Basal cell carcinoma of face 07/02/2014   Nodular pattern. Left temple   Basal cell carcinoma of face 05/17/2017   Nodular pattern. Right Zygoma. Tx: EDC   Basal cell carcinoma of trunk 01/18/2010   Superficial. Left mid to upper back, ~5.0cm lat. to spine.   Basal cell carcinoma of trunk 01/18/2010   Right mid to upper back, 4.0cm lat. to spine   Basal cell carcinoma of trunk 03/04/2015   Superficial. RIght low back lateral.   Basal cell carcinoma of trunk 02/10/2016   Superficial. Right upper back   Basal cell carcinoma of trunk 02/10/2016   Superficial. Left upper back.   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 pretibial just below knee   BCC (basal cell carcinoma of skin) 08/09/2021   Upper back spinal, EDC   Cancer (HCC)    akin cancer areas removed   Coronary artery disease    Diverticulitis    DJD (  degenerative joint disease)    bulging disc upper and lower back   Elevated cholesterol    GERD (gastroesophageal reflux disease)    Hypertension    Squamous cell carcinoma of face 09/28/2009   SCCis. Left forehead.   Squamous cell carcinoma of skin 11/03/2008   Well differentiated SCC. Left cheek/mid mandible. Excised: 12/10/2008   Squamous cell carcinoma of skin 10/21/2019   right lat neck/infra auricular   Squamous cell carcinoma of skin 04/27/2022   right ear, EDC   Squamous cell carcinoma of skin 05/16/2022   left cheek, txt'd with EDC   Squamous cell carcinoma of skin 05/16/2022   right forehead, txt'd with EDC   Squamous cell carcinoma of trunk  07/04/2018   Well differentiated SCC with superficial infiltration. Left suprasternal notch. Tx: EDC.    Past Surgical History:  Procedure Laterality Date   COLONOSCOPY WITH PROPOFOL N/A 02/22/2016   Procedure: COLONOSCOPY WITH PROPOFOL;  Surgeon: Charolett Bumpers, MD;  Location: [facility];  Service: Endoscopy;  Laterality: N/A;   moes procedure for skin cancer 2016     skin cancer area removed  02/10/2016   upper back and and face   tumor rmoved from right hand  1980   right hand   wrist surgery Right      Current Outpatient Medications  Medication Sig Dispense Refill   amLODipine-benazepril (LOTREL) 5-20 MG per capsule Take 1 capsule by mouth daily.      aspirin EC 81 MG tablet Take 81 mg by mouth daily.      cetirizine (ZYRTEC) 10 MG tablet Take 10 mg by mouth daily as needed for allergies.     clobetasol cream (TEMOVATE) 0.05 % Apply topically 2 (two) times daily.     colchicine 0.6 MG tablet Take 1 tablet by mouth as needed.     docusate sodium (COLACE) 100 MG capsule Take 100 mg by mouth as needed.     ezetimibe (ZETIA) 10 MG tablet Take 0.5 tablets (5 mg total) by mouth daily. 15 tablet 0   fluticasone (FLONASE) 50 MCG/ACT nasal spray Place 1 spray into both nostrils daily.      Omega-3 Fatty Acids (FISH OIL) 1200 MG CPDR Take 1,200 mg by mouth daily.     omeprazole (PRILOSEC) 40 MG capsule Take 40 mg by mouth every morning.     prednisoLONE acetate (PRED FORTE) 1 % ophthalmic suspension 1 drop 4 (four) times daily.     rosuvastatin (CRESTOR) 5 MG tablet Take 1 tablet (5 mg total) by mouth every Monday, Wednesday, and Friday. Patient needs to keep appt in Sept for any future refills. 36 tablet 0   traMADol (ULTRAM) 50 MG tablet 1 tablet as needed Orally Twice a day as needed for 30 days     No current facility-administered medications for this visit.    Allergies:   Ciprofloxacin, Clarithromycin, Codeine, Indomethacin, Penicillins, Uroxatral [alfuzosin], and Zocor  [simvastatin]    Social History:  The patient  reports that he has never smoked. He has never used smokeless tobacco. He reports that he does not drink alcohol and does not use drugs.   Family History:  The patient's family history includes Heart attack in his father; Heart disease in his father and mother.    ROS:  Please see the history of present illness.   Otherwise, review of systems are positive for .   All other systems are reviewed and negative.    PHYSICAL EXAM: VS:  BP 122/76  Pulse 62   Ht 5\' 11"  (1.803 m)   Wt 176 lb (79.8 kg)   SpO2 94%   BMI 24.55 kg/m  , BMI Body mass index is 24.55 kg/m. GEN: Well nourished, well developed, in no acute distress HEENT: normal Neck: no JVD, carotid bruits, or masses Cardiac: RRR; no murmurs, rubs, or gallops,trivial pretibial edema  Respiratory:  clear to auscultation bilaterally, normal work of breathing GI: soft, nontender, nondistended, + BS MS: no deformity or atrophy Skin: warm and dry, no rash Neuro:  Strength and sensation are intact Psych: euthymic mood, full affect   EKG:   The ekg ordered today demonstrates NSR, no ST changes   Recent Labs: No results found for requested labs within last 365 days.   Lipid Panel    Component Value Date/Time   CHOL 144 12/14/2020 0836   TRIG 96 12/14/2020 0836   HDL 53 12/14/2020 0836   CHOLHDL 2.7 12/14/2020 0836   LDLCALC 73 12/14/2020 0836     Other studies Reviewed: Additional studies/ records that were reviewed today with results demonstrating: LDL 118 in 01/2023.   ASSESSMENT AND PLAN:  CAD: Known LAD disease from prior CTA.  Medical therapy.  No angina on current meds.   Hyperlipidemia: Had a question of side effects with statins.  No change in symptoms when he stopped the statin so he went back on rosuvastatin 5 mg 3 times per week along with Zetia 5 mg daily.  LDL 118.  LDL target 70. We discussed increasing the frequency to 5 days/week and he is agreeable.   Recheck liver and lipid tests in 3 months.   Hypertension: Home readings are well controlled.  Leg pains: not related to exertion. Can try to use CoQ10 100-200 mg to see if that helps leg pain.    Current medicines are reviewed at length with the patient today.  The patient concerns regarding his medicines were addressed.  The following changes have been made:  No change  Labs/ tests ordered today include:   Orders Placed This Encounter  Procedures   EKG 12-Lead    Recommend 150 minutes/week of aerobic exercise Low fat, low carb, high fiber diet recommended  Disposition:   FU in  1 year with Dr. Anne Fu   Signed, Lance Muss, MD  04/14/2023 2:52 PM    [facility] [address] [place], [place], [place]  [address] Phone: [phone]; Fax: [phone]

## 2023-04-14 ENCOUNTER — Ambulatory Visit: Payer: Medicare Other | Attending: Interventional Cardiology | Admitting: Interventional Cardiology

## 2023-04-14 VITALS — BP 122/76 | HR 62 | Ht 71.0 in | Wt 176.0 lb

## 2023-04-14 DIAGNOSIS — I2510 Atherosclerotic heart disease of native coronary artery without angina pectoris: Secondary | ICD-10-CM | POA: Insufficient documentation

## 2023-04-14 DIAGNOSIS — I739 Peripheral vascular disease, unspecified: Secondary | ICD-10-CM | POA: Insufficient documentation

## 2023-04-14 DIAGNOSIS — E785 Hyperlipidemia, unspecified: Secondary | ICD-10-CM | POA: Diagnosis not present

## 2023-04-14 DIAGNOSIS — I10 Essential (primary) hypertension: Secondary | ICD-10-CM | POA: Diagnosis not present

## 2023-04-14 MED ORDER — ROSUVASTATIN CALCIUM 5 MG PO TABS: ORAL_TABLET | ORAL | 3 refills | Status: DC

## 2023-04-14 NOTE — Patient Instructions (Signed)
Medication Instructions:  Your physician has recommended you make the following change in your medication:  Increase Rosuvastatin to 5 mg by mouth 5 days per week Can try Co Q 10 100-200 mg by mouth daily to see if this helps with muscle pain  *If you need a refill on your cardiac medications before your next appointment, please call your pharmacy*   Lab Work: Your physician recommends that you return for lab work in: 3 months. July 14, 2023 Lipid and liver profiles.  This will be fasting.  The lab opens at 7:15 AM  If you have labs (blood work) drawn today and your tests are completely normal, you will receive your results only by: MyChart Message (if you have MyChart) OR A paper copy in the mail If you have any lab test that is abnormal or we need to change your treatment, we will call you to review the results.   Testing/Procedures: none   Follow-Up: At [facility], you and your health needs are our priority.  As part of our continuing mission to provide you with exceptional heart care, we have created designated Provider Care Teams.  These Care Teams include your primary Cardiologist (physician) and Advanced Practice Providers (APPs -  Physician Assistants and Nurse Practitioners) who all work together to provide you with the care you need, when you need it.  We recommend signing up for the patient portal called "MyChart".  Sign up information is provided on this After Visit Summary.  MyChart is used to connect with patients for Virtual Visits (Telemedicine).  Patients are able to view lab/test results, encounter notes, upcoming appointments, etc.  Non-urgent messages can be sent to your provider as well.   To learn more about what you can do with MyChart, go to [email or web address].    Your next appointment:   12 month(s)  Provider:   Dr Anne Fu     Other Instructions

## 2023-04-26 DIAGNOSIS — H2513 Age-related nuclear cataract, bilateral: Secondary | ICD-10-CM | POA: Diagnosis not present

## 2023-05-10 ENCOUNTER — Other Ambulatory Visit: Payer: Self-pay | Admitting: Interventional Cardiology

## 2023-05-13 ENCOUNTER — Other Ambulatory Visit: Payer: Self-pay | Admitting: Cardiology

## 2023-07-04 DIAGNOSIS — L570 Actinic keratosis: Secondary | ICD-10-CM | POA: Diagnosis not present

## 2023-07-04 DIAGNOSIS — C4441 Basal cell carcinoma of skin of scalp and neck: Secondary | ICD-10-CM | POA: Diagnosis not present

## 2023-07-04 DIAGNOSIS — C44619 Basal cell carcinoma of skin of left upper limb, including shoulder: Secondary | ICD-10-CM | POA: Diagnosis not present

## 2023-07-04 DIAGNOSIS — D485 Neoplasm of uncertain behavior of skin: Secondary | ICD-10-CM | POA: Diagnosis not present

## 2023-07-04 DIAGNOSIS — Z08 Encounter for follow-up examination after completed treatment for malignant neoplasm: Secondary | ICD-10-CM | POA: Diagnosis not present

## 2023-07-04 DIAGNOSIS — C44329 Squamous cell carcinoma of skin of other parts of face: Secondary | ICD-10-CM | POA: Diagnosis not present

## 2023-07-04 DIAGNOSIS — Z85820 Personal history of malignant melanoma of skin: Secondary | ICD-10-CM | POA: Diagnosis not present

## 2023-07-04 DIAGNOSIS — Z85828 Personal history of other malignant neoplasm of skin: Secondary | ICD-10-CM | POA: Diagnosis not present

## 2023-07-04 DIAGNOSIS — L821 Other seborrheic keratosis: Secondary | ICD-10-CM | POA: Diagnosis not present

## 2023-07-04 DIAGNOSIS — R229 Localized swelling, mass and lump, unspecified: Secondary | ICD-10-CM | POA: Diagnosis not present

## 2023-07-04 DIAGNOSIS — C4331 Malignant melanoma of nose: Secondary | ICD-10-CM | POA: Diagnosis not present

## 2023-07-14 ENCOUNTER — Other Ambulatory Visit: Payer: Medicare Other

## 2023-07-14 DIAGNOSIS — E785 Hyperlipidemia, unspecified: Secondary | ICD-10-CM

## 2023-07-24 DIAGNOSIS — C44619 Basal cell carcinoma of skin of left upper limb, including shoulder: Secondary | ICD-10-CM | POA: Diagnosis not present

## 2023-08-25 DIAGNOSIS — C4441 Basal cell carcinoma of skin of scalp and neck: Secondary | ICD-10-CM | POA: Diagnosis not present

## 2023-09-04 DIAGNOSIS — M519 Unspecified thoracic, thoracolumbar and lumbosacral intervertebral disc disorder: Secondary | ICD-10-CM | POA: Diagnosis not present

## 2023-09-04 DIAGNOSIS — I7091 Generalized atherosclerosis: Secondary | ICD-10-CM | POA: Diagnosis not present

## 2023-09-04 DIAGNOSIS — R7303 Prediabetes: Secondary | ICD-10-CM | POA: Diagnosis not present

## 2023-09-04 DIAGNOSIS — M109 Gout, unspecified: Secondary | ICD-10-CM | POA: Diagnosis not present

## 2023-09-04 DIAGNOSIS — G720 Drug-induced myopathy: Secondary | ICD-10-CM | POA: Diagnosis not present

## 2023-09-04 DIAGNOSIS — E782 Mixed hyperlipidemia: Secondary | ICD-10-CM | POA: Diagnosis not present

## 2023-09-04 DIAGNOSIS — N182 Chronic kidney disease, stage 2 (mild): Secondary | ICD-10-CM | POA: Diagnosis not present

## 2023-09-04 DIAGNOSIS — I10 Essential (primary) hypertension: Secondary | ICD-10-CM | POA: Diagnosis not present

## 2023-09-25 DIAGNOSIS — C44329 Squamous cell carcinoma of skin of other parts of face: Secondary | ICD-10-CM | POA: Diagnosis not present

## 2023-12-02 ENCOUNTER — Other Ambulatory Visit: Payer: Self-pay | Admitting: Interventional Cardiology

## 2024-02-05 DIAGNOSIS — I10 Essential (primary) hypertension: Secondary | ICD-10-CM | POA: Diagnosis not present

## 2024-02-05 DIAGNOSIS — M109 Gout, unspecified: Secondary | ICD-10-CM | POA: Diagnosis not present

## 2024-02-05 DIAGNOSIS — N182 Chronic kidney disease, stage 2 (mild): Secondary | ICD-10-CM | POA: Diagnosis not present

## 2024-02-05 DIAGNOSIS — G720 Drug-induced myopathy: Secondary | ICD-10-CM | POA: Diagnosis not present

## 2024-02-05 DIAGNOSIS — I7091 Generalized atherosclerosis: Secondary | ICD-10-CM | POA: Diagnosis not present

## 2024-02-05 DIAGNOSIS — E782 Mixed hyperlipidemia: Secondary | ICD-10-CM | POA: Diagnosis not present

## 2024-02-05 DIAGNOSIS — N400 Enlarged prostate without lower urinary tract symptoms: Secondary | ICD-10-CM | POA: Diagnosis not present

## 2024-02-05 DIAGNOSIS — Z23 Encounter for immunization: Secondary | ICD-10-CM | POA: Diagnosis not present

## 2024-02-05 DIAGNOSIS — C4490 Unspecified malignant neoplasm of skin, unspecified: Secondary | ICD-10-CM | POA: Diagnosis not present

## 2024-02-05 DIAGNOSIS — R7303 Prediabetes: Secondary | ICD-10-CM | POA: Diagnosis not present

## 2024-02-05 DIAGNOSIS — Z1331 Encounter for screening for depression: Secondary | ICD-10-CM | POA: Diagnosis not present

## 2024-02-05 DIAGNOSIS — Z0000 Encounter for general adult medical examination without abnormal findings: Secondary | ICD-10-CM | POA: Diagnosis not present

## 2024-05-22 ENCOUNTER — Encounter: Payer: Self-pay | Admitting: Dermatology

## 2024-05-22 ENCOUNTER — Ambulatory Visit: Admitting: Dermatology

## 2024-05-22 VITALS — BP 158/72 | HR 60

## 2024-05-22 DIAGNOSIS — C44319 Basal cell carcinoma of skin of other parts of face: Secondary | ICD-10-CM

## 2024-05-22 DIAGNOSIS — L578 Other skin changes due to chronic exposure to nonionizing radiation: Secondary | ICD-10-CM

## 2024-05-22 DIAGNOSIS — C44511 Basal cell carcinoma of skin of breast: Secondary | ICD-10-CM

## 2024-05-22 DIAGNOSIS — L570 Actinic keratosis: Secondary | ICD-10-CM | POA: Diagnosis not present

## 2024-05-22 DIAGNOSIS — D485 Neoplasm of uncertain behavior of skin: Secondary | ICD-10-CM

## 2024-05-22 DIAGNOSIS — D1801 Hemangioma of skin and subcutaneous tissue: Secondary | ICD-10-CM | POA: Diagnosis not present

## 2024-05-22 DIAGNOSIS — Z85828 Personal history of other malignant neoplasm of skin: Secondary | ICD-10-CM

## 2024-05-22 DIAGNOSIS — W908XXA Exposure to other nonionizing radiation, initial encounter: Secondary | ICD-10-CM | POA: Diagnosis not present

## 2024-05-22 DIAGNOSIS — Z85820 Personal history of malignant melanoma of skin: Secondary | ICD-10-CM | POA: Diagnosis not present

## 2024-05-22 DIAGNOSIS — D229 Melanocytic nevi, unspecified: Secondary | ICD-10-CM

## 2024-05-22 DIAGNOSIS — C4491 Basal cell carcinoma of skin, unspecified: Secondary | ICD-10-CM

## 2024-05-22 DIAGNOSIS — L814 Other melanin hyperpigmentation: Secondary | ICD-10-CM | POA: Diagnosis not present

## 2024-05-22 DIAGNOSIS — L821 Other seborrheic keratosis: Secondary | ICD-10-CM | POA: Diagnosis not present

## 2024-05-22 DIAGNOSIS — Z1283 Encounter for screening for malignant neoplasm of skin: Secondary | ICD-10-CM | POA: Diagnosis not present

## 2024-05-22 DIAGNOSIS — C4441 Basal cell carcinoma of skin of scalp and neck: Secondary | ICD-10-CM

## 2024-05-22 DIAGNOSIS — Z8589 Personal history of malignant neoplasm of other organs and systems: Secondary | ICD-10-CM

## 2024-05-22 HISTORY — DX: Basal cell carcinoma of skin, unspecified: C44.91

## 2024-05-22 MED ORDER — FLUOROURACIL 5 % EX CREA: TOPICAL_CREAM | Freq: Two times a day (BID) | CUTANEOUS | 2 refills | Status: AC

## 2024-05-22 NOTE — Patient Instructions (Addendum)

## 2024-05-22 NOTE — Progress Notes (Signed)
 New Patient Visit   Subjective  Paul Mann is a 78 y.o. male who presents for the following: Skin Cancer Screening and Full Body Skin Exam  The patient presents for Total-Body Skin Exam (TBSE) for skin cancer screening and mole check. The patient has spots, moles and lesions to be evaluated, some may be new or changing. He is accompanied by his wife.  Patient has previously seen Dr. Lloyd at [facility] for his Mohs Surgery. Patient is here today to reestablish dermatology care due to his extensive skin cancer history.   Patient has a spot on his temple that has been present for about 1 year. He has used Efudex  around the area but not directly over the area. He last used efudex  in March 2025. Patient has had Blue Light treatment several times.  There is a patch of scale on the left side of the nose that has appeared in the last 3 weeks.   The following portions of the chart were reviewed this encounter and updated as appropriate: medications, allergies, medical history  Review of Systems:  No other skin or systemic complaints except as noted in HPI or Assessment and Plan.  Objective  Well appearing patient in no apparent distress; mood and affect are within normal limits.  A full examination was performed including scalp, head, eyes, ears, nose, lips, neck, chest, axillae, abdomen, back, buttocks, bilateral upper extremities, bilateral lower extremities, hands, feet, fingers, toes, fingernails, and toenails. All findings within normal limits unless otherwise noted below.   Relevant physical exam findings are noted in the Assessment and Plan.  Left Forearm - Posterior, Left Hand - Posterior, Left Nasal Sidewall, Left Preauricular Area, Right Forearm - Posterior (5), Right Temple Erythematous thin papules/macules with gritty scale. Erythematous thin papules/macules with gritty scale.  Right Frontal Scalp 1.7cm hemorrhagic crusted plaque  Right Temple 9mm pink scaly  papule  Right Breast 8mm pink papule   Assessment & Plan   SKIN CANCER SCREENING PERFORMED TODAY.  ACTINIC DAMAGE - Chronic condition, secondary to cumulative UV/sun exposure - diffuse scaly erythematous macules with underlying dyspigmentation - Recommend daily broad spectrum sunscreen SPF 30+ to sun-exposed areas, reapply every 2 hours as needed.  - Staying in the shade or wearing long sleeves, sun glasses (UVA+UVB protection) and wide brim hats (4-inch brim around the entire circumference of the hat) are also recommended for sun protection.  - Call for new or changing lesions.  LENTIGINES, SEBORRHEIC KERATOSES, HEMANGIOMAS - Benign normal skin lesions - Benign-appearing - Call for any changes  MELANOCYTIC NEVI - Tan-brown and/or pink-flesh-colored symmetric macules and papules - Benign appearing on exam today - Observation - Call clinic for new or changing moles - Recommend daily use of broad spectrum spf 30+ sunscreen to sun-exposed areas.   HISTORY OF SQUAMOUS CELL CARCINOMA OF THE SKIN - No evidence of recurrence today - No lymphadenopathy - Recommend regular full body skin exams - Recommend daily broad spectrum sunscreen SPF 30+ to sun-exposed areas, reapply every 2 hours as needed.  - Call if any new or changing lesions are noted between office visits  HISTORY OF BASAL CELL CARCINOMA OF THE SKIN - No evidence of recurrence today - Recommend regular full body skin exams - Recommend daily broad spectrum sunscreen SPF 30+ to sun-exposed areas, reapply every 2 hours as needed.  - Call if any new or changing lesions are noted between office visits  HISTORY OF MELANOMA - No evidence of recurrence today - No  lymphadenopathy - Recommend regular full body skin exams - Recommend daily broad spectrum sunscreen SPF 30+ to sun-exposed areas, reapply every 2 hours as needed.  - Call if any new or changing lesions are noted between office visits  ACTINIC KERATOSIS Exam:  Erythematous thin papules/macules with gritty scale at the forehead, temple and ears  Actinic keratoses are precancerous spots that appear secondary to cumulative UV radiation exposure/sun exposure over time. They are chronic with expected duration over 1 year. A portion of actinic keratoses will progress to squamous cell carcinoma of the skin. It is not possible to reliably predict which spots will progress to skin cancer and so treatment is recommended to prevent development of skin cancer.  Recommend daily broad spectrum sunscreen SPF 30+ to sun-exposed areas, reapply every 2 hours as needed.  Recommend staying in the shade or wearing long sleeves, sun glasses (UVA+UVB protection) and wide brim hats (4-inch brim around the entire circumference of the hat). Call for new or changing lesions.  Treatment Plan: Start 5-fluorouracil  cream twice a day for 14 days to affected areas including the forehead, temple and ears.  Reviewed course of treatment and expected reaction.  Patient advised to expect inflammation and crusting and advised that erosions are possible.  Patient advised to be diligent with sun protection during and after treatment. Handout with details of how to apply medication and what to expect provided. Counseled to keep medication out of reach of children and pets.  Reviewed course of treatment and expected reaction.  Patient advised to expect inflammation and crusting and advised that erosions are possible.  Patient advised to be diligent with sun protection during and after treatment. Handout with details of how to apply medication and what to expect provided. Counseled to keep medication out of reach of children and pets.   Patient advised that he may treat the areas in stages to reduce discomfort. Discussed that he may need to use the efudex  yearly for maintenance.  AK (ACTINIC KERATOSIS) (10) Left Forearm - Posterior, Left Hand - Posterior, Left Nasal Sidewall, Left Preauricular Area,  Right Forearm - Posterior (5), Right Temple Destruction of lesion - Left Forearm - Posterior, Left Hand - Posterior, Left Nasal Sidewall, Left Preauricular Area, Right Forearm - Posterior (5), Right Temple Complexity: simple   Destruction method: cryotherapy   Informed consent: discussed and consent obtained   Lesion destroyed using liquid nitrogen: Yes   Region frozen until ice ball extended beyond lesion: Yes   Outcome: patient tolerated procedure well with no complications   Post-procedure details: wound care instructions given    NEOPLASM OF UNCERTAIN BEHAVIOR OF SKIN (3) Right Frontal Scalp Skin / nail biopsy Type of biopsy: tangential   Informed consent: discussed and consent obtained   Timeout: patient name, date of birth, surgical site, and procedure verified   Procedure prep:  Patient was prepped and draped in usual sterile fashion Prep type:  Isopropyl alcohol Anesthesia: the lesion was anesthetized in a standard fashion   Anesthetic:  1% lidocaine  w/ epinephrine 1-100,000 buffered w/ 8.4% NaHCO3 Instrument used: DermaBlade   Hemostasis achieved with: aluminum chloride   Outcome: patient tolerated procedure well   Post-procedure details: sterile dressing applied and wound care instructions given   Dressing type: petrolatum and bandage    Specimen 1 - Surgical pathology Differential Diagnosis: r/o NMSC vs other  Check Margins: No Right Temple Skin / nail biopsy Type of biopsy: tangential   Informed consent: discussed and consent obtained   Timeout: patient  name, date of birth, surgical site, and procedure verified   Procedure prep:  Patient was prepped and draped in usual sterile fashion Prep type:  Isopropyl alcohol Anesthesia: the lesion was anesthetized in a standard fashion   Anesthetic:  1% lidocaine  w/ epinephrine 1-100,000 buffered w/ 8.4% NaHCO3 Instrument used: DermaBlade   Hemostasis achieved with: aluminum chloride   Outcome: patient tolerated procedure  well   Post-procedure details: sterile dressing applied and wound care instructions given   Dressing type: bandage and petrolatum    Specimen 2 - Surgical pathology Differential Diagnosis: r/o NMSC vs other  Check Margins: No Right Breast Skin / nail biopsy Type of biopsy: tangential   Informed consent: discussed and consent obtained   Timeout: patient name, date of birth, surgical site, and procedure verified   Procedure prep:  Patient was prepped and draped in usual sterile fashion Prep type:  Isopropyl alcohol Anesthesia: the lesion was anesthetized in a standard fashion   Anesthetic:  1% lidocaine  w/ epinephrine 1-100,000 buffered w/ 8.4% NaHCO3 Hemostasis achieved with: aluminum chloride   Outcome: patient tolerated procedure well   Post-procedure details: sterile dressing applied and wound care instructions given   Dressing type: bandage and petrolatum    Specimen 3 - Surgical pathology Differential Diagnosis: r/o NMSC vs other  Check Margins: No ACTINIC SKIN DAMAGE   HISTORY OF BASAL CELL CANCER   HISTORY OF SQUAMOUS CELL CARCINOMA   PERSONAL HISTORY OF MALIGNANT MELANOMA OF SKIN   ACTINIC KERATOSES   LENTIGINES   SEBORRHEIC KERATOSES   CHERRY ANGIOMA   MULTIPLE BENIGN NEVI    Return in about 4 months (around 09/22/2024) for FBSE and efudex  follow up.  LILLETTE Rollene Gobble, RN, am acting as scribe for RUFUS CHRISTELLA HOLY, MD .   Documentation: I have reviewed the above documentation for accuracy and completeness, and I agree with the above.  RUFUS CHRISTELLA HOLY, MD

## 2024-05-23 LAB — SURGICAL PATHOLOGY

## 2024-05-24 ENCOUNTER — Ambulatory Visit: Payer: Self-pay | Admitting: Dermatology

## 2024-05-24 NOTE — Progress Notes (Signed)
 Spoke with HIPAA spouse and gave her bx results and treatment recommendations for eac

## 2024-05-26 ENCOUNTER — Other Ambulatory Visit: Payer: Self-pay | Admitting: Cardiology

## 2024-06-23 ENCOUNTER — Other Ambulatory Visit: Payer: Self-pay | Admitting: Physician Assistant

## 2024-07-04 ENCOUNTER — Encounter: Admitting: Dermatology

## 2024-07-22 ENCOUNTER — Ambulatory Visit: Admitting: Dermatology

## 2024-07-22 NOTE — Patient Instructions (Addendum)
 SABRA

## 2024-07-22 NOTE — Progress Notes (Deleted)
" ° °  Follow-Up Visit   Subjective  Paul Mann is a 78 y.o. male who presents for the following: Mohs of right temple  The following portions of the chart were reviewed this encounter and updated as appropriate: medications, allergies, medical history  Review of Systems:  No other skin or systemic complaints except as noted in HPI or Assessment and Plan.  Objective  Well appearing patient in no apparent distress; mood and affect are within normal limits.  A focused examination was performed of the following areas: Right temple Relevant physical exam findings are noted in the Assessment and Plan.     Assessment & Plan      Return in about 4 weeks (around 08/19/2024) for wound check.  I, Darice Smock, CMA, am acting as scribe for RUFUS CHRISTELLA HOLY, MD.   Documentation: I have reviewed the above documentation for accuracy and completeness, and I agree with the above.  RUFUS CHRISTELLA HOLY, MD  "

## 2024-08-08 ENCOUNTER — Encounter: Payer: Self-pay | Admitting: Dermatology

## 2024-08-12 ENCOUNTER — Ambulatory Visit: Admitting: Dermatology

## 2024-08-12 ENCOUNTER — Encounter: Payer: Self-pay | Admitting: Dermatology

## 2024-08-12 VITALS — BP 156/62 | HR 57 | Temp 98.2°F

## 2024-08-12 DIAGNOSIS — C44319 Basal cell carcinoma of skin of other parts of face: Secondary | ICD-10-CM | POA: Diagnosis not present

## 2024-08-12 DIAGNOSIS — L814 Other melanin hyperpigmentation: Secondary | ICD-10-CM

## 2024-08-12 DIAGNOSIS — L578 Other skin changes due to chronic exposure to nonionizing radiation: Secondary | ICD-10-CM

## 2024-08-12 NOTE — Patient Instructions (Signed)

## 2024-08-12 NOTE — Progress Notes (Signed)
 "  Follow-Up Visit   Subjective  Paul Mann is a 79 y.o. male who presents for the following: Excision of a Superficial and Nodular BCC of the right temple, biopsied by Dr Corey. Patient is accompanied by his wife.   The following portions of the chart were reviewed this encounter and updated as appropriate: medications, allergies, medical history  Review of Systems:  No other skin or systemic complaints except as noted in HPI or Assessment and Plan.  Objective  Well appearing patient in no apparent distress; mood and affect are within normal limits.  A focused examination was performed of the following areas: Face  Relevant physical exam findings are noted in the Assessment and Plan.   Right Temple Large pink sclerotic plaque   Assessment & Plan   BASAL CELL CARCINOMA (BCC) OF RIGHT TEMPLE REGION Right Temple - Mohs surgery  Consent obtained: written  Anticoagulation: Is the patient taking prescription anticoagulant and/or aspirin prescribed/recommended by a physician? Yes   Was the anticoagulation regimen changed prior to Mohs? No    Anesthesia: Anesthesia method: local infiltration Local anesthetic: lidocaine  1% WITH epi  Procedure Details: Timeout: pre-procedure verification complete Procedure Prep: patient was prepped and draped in usual sterile fashion Prep type: chlorhexidine Pre-Op diagnosis: basal cell carcinoma BCC subtype: nodular MohsAIQ Surgical site (if tumor spans multiple areas, please select predominant area): temple Surgery side: right Surgical site (from skin exam): Right Temple Pre-operative length (cm): 0.8 Pre-operative width (cm): 0.8 Indications for Mohs surgery: anatomic location where tissue conservation is critical and ill-defined borders  Micrographic Surgery Details: Post-operative length (cm): 4.5 Post-operative width (cm): 4.8 Number of Mohs stages: 5  Stage 1    Tumor features identified on Mohs section: basal carcinoma     Depth of tumor invasion after stage: dermis  Stage 2    Tumor features identified on Mohs section: basal carcinoma    Depth of tumor invasion after stage: dermis  Stage 3    Tumor features identified on Mohs section: basal carcinoma    Depth of tumor invasion after stage: dermis  Stage 4    Tumor features identified on Mohs section: basal carcinoma    Depth of tumor invasion after stage: dermis  Stage 5    Tumor features identified on Mohs section: basal carcinoma    Depth of tumor invasion after stage: dermis  Reconstruction: Was the defect reconstructed?: No      Return in about 1 day (around 08/13/2024) for continued Mohs surgery.  I, Doyce Pan, CMA, am acting as scribe for RUFUS CHRISTELLA COREY, MD.    08/12/2024  HISTORY OF PRESENT ILLNESS  Paul Mann is seen in consultation at the request of Dr. Corey for biopsy-proven Superficial and Nodular Basal Cell Carcinoma on the right temple. They note that the area has been present for about 3 years increasing in size with time.  There is no history of previous treatment.  Reports no other new or changing lesions and has no other complaints today.  Medications and allergies: see patient chart.  Review of systems: Reviewed 8 systems and notable for the above skin cancer.  All other systems reviewed are unremarkable/negative, unless noted in the HPI. Past medical history, surgical history, family history, social history were also reviewed and are noted in the chart/questionnaire.    PHYSICAL EXAMINATION  General: Well-appearing, in no acute distress, alert and oriented x 4. Vitals reviewed in chart (if available).   Skin: Exam reveals a 0.8 x 0.8  cm erythematous papule and biopsy scar on the right temple. There are rhytids, telangiectasias, and lentigines, consistent with photodamage.  Biopsy report(s) reviewed, confirming the diagnosis.   ASSESSMENT  1) Superficial and Nodular Basal Cell Carcinoma on the right temple 2)  photodamage 3) solar lentigines   PLAN   1. Due to location, size, histology, or recurrence and the likelihood of subclinical extension as well as the need to conserve normal surrounding tissue, the patient was deemed acceptable for Mohs micrographic surgery (MMS).  The nature and purpose of the procedure, associated benefits and risks including recurrence and scarring, possible complications such as pain, infection, and bleeding, and alternative methods of treatment if appropriate were discussed with the patient during consent. The lesion location was verified by the patient, by reviewing previous notes, pathology reports, and by photographs as well as angulation measurements if available.  Informed consent was reviewed and signed by the patient, and timeout was performed at 10:00 AM. See op note below.  2. For the photodamage and solar lentigines, sun protection discussed/information given on OTC sunscreens, and we recommend continued regular follow-up with primary dermatologist every 6 months or sooner for any growing, bleeding, or changing lesions. 3. Prognosis and future surveillance discussed. 4. Letter with treatment outcome sent to referring provider. 5. Pain acetaminophen/ibuprofen  MOHS MICROGRAPHIC SURGERY AND RECONSTRUCTION  Initial size:   0.8 x 0.8 cm Surgical defect/wound size: 4.5 x 4.8 cm Anesthesia:    0.33% lidocaine  with 1:200,000 epinephrine EBL:    <5 mL Complications:  None Repair type:   Delayed Repair due to Continued positive margins  Stages: 5  STAGE I: Anesthesia achieved with 0.5% lidocaine  with 1:200,000 epinephrine. ChloraPrep applied. 2 section(s) excised using Mohs technique (this includes total peripheral and deep tissue margin excision and evaluation with frozen sections, excised and interpreted by the same physician). The tumor was first debulked and then excised with an approx. 2 mm margin.  Hemostasis was achieved with electrocautery as needed.  The  specimen was then oriented, subdivided/relaxed, inked, and processed using Mohs technique.    Frozen section analysis revealed a positive margin for  multiple, small buds of basaloid cells descending from the epidermis with no dermal invasion and islands of cells with peripheral palisading and a haphazard arrangement of the more central cells in the peripheral and deep margin.    STAGE II: An additional 2 mm margin was excised.  Hemostasis was achieved with electrocautery as needed.  The specimen was then oriented, subdivided/relaxed, inked, and processed using Mohs technique.   Frozen section analysis revealed a positive margin for  multiple, small buds of basaloid cells descending from the epidermis with no dermal invasion and islands of cells with peripheral palisading and a haphazard arrangement of the more central cells in the peripheral and deep margin.  STAGE III: An additional 2 mm margin was excised.  Hemostasis was achieved with electrocautery as needed.  The specimen was then oriented, subdivided/relaxed, inked, and processed using Mohs technique.   Frozen section analysis revealed a positive margin for  multiple, small buds of basaloid cells descending from the epidermis with no dermal invasion and islands of cells with peripheral palisading and a haphazard arrangement of the more central cells in the peripheral and deep margin.  STAGE IV: An additional 2 mm margin was excised.  Hemostasis was achieved with electrocautery as needed.  The specimen was then oriented, subdivided/relaxed, inked, and processed using Mohs technique.   Frozen section analysis revealed a positive margin for  multiple, small buds of basaloid cells descending from the epidermis with no dermal invasion and islands of cells with peripheral palisading and a haphazard arrangement of the more central cells in the peripheral and deep margin.  STAGE III: An additional 2 mm margin was excised.  Hemostasis was achieved with  electrocautery as needed.  The specimen was then oriented, subdivided/relaxed, inked, and processed using Mohs technique.   Frozen section analysis revealed a positive margin for  multiple, small buds of basaloid cells descending from the epidermis with no dermal invasion and islands of cells with peripheral palisading and a haphazard arrangement of the more central cells in the peripheral and deep margin.  Reconstruction  Due to continued positive margins on Mohs sections, the case will continued on the next business day. Positive margins will need to be cleared prior to proceeding with reconstruction.  Documentation: I have reviewed the above documentation for accuracy and completeness, and I agree with the above.  RUFUS CHRISTELLA HOLY, MD  "

## 2024-08-13 ENCOUNTER — Encounter: Payer: Self-pay | Admitting: Dermatology

## 2024-08-13 ENCOUNTER — Encounter: Admitting: Dermatology

## 2024-08-13 ENCOUNTER — Ambulatory Visit: Admitting: Dermatology

## 2024-08-13 VITALS — BP 158/60 | HR 60 | Temp 98.2°F

## 2024-08-13 DIAGNOSIS — L578 Other skin changes due to chronic exposure to nonionizing radiation: Secondary | ICD-10-CM | POA: Diagnosis not present

## 2024-08-13 DIAGNOSIS — L814 Other melanin hyperpigmentation: Secondary | ICD-10-CM

## 2024-08-13 DIAGNOSIS — C44319 Basal cell carcinoma of skin of other parts of face: Secondary | ICD-10-CM | POA: Diagnosis not present

## 2024-08-13 MED ORDER — OXYCODONE HCL 5 MG PO TABS: 5.0000 mg | ORAL_TABLET | Freq: Four times a day (QID) | ORAL | 0 refills | Status: DC | PRN

## 2024-08-13 NOTE — Progress Notes (Signed)
 "  Follow-Up Visit   Subjective  Paul Mann is a 79 y.o. male who presents for the following: Continuation of Mohs Surgery for continued positive margins started on 08/12/2024 for a BCC on the right temple. Patient is accompanied by his wife.   The following portions of the chart were reviewed this encounter and updated as appropriate: medications, allergies, medical history  Review of Systems:  No other skin or systemic complaints except as noted in HPI or Assessment and Plan.  Objective  Well appearing patient in no apparent distress; mood and affect are within normal limits.  A focused examination was performed of the following areas: Face  Relevant physical exam findings are noted in the Assessment and Plan.   Right Temple Open surgical wound   Assessment & Plan   BASAL CELL CARCINOMA (BCC) OF RIGHT TEMPLE REGION Right Temple - Mohs surgery  Consent obtained: written  Anticoagulation: Is the patient taking prescription anticoagulant and/or aspirin prescribed/recommended by a physician? Yes   Was the anticoagulation regimen changed prior to Mohs? No    Anesthesia: Anesthesia method: local infiltration Local anesthetic: lidocaine  1% WITH epi  Procedure Details: Timeout: pre-procedure verification complete Procedure Prep: patient was prepped and draped in usual sterile fashion Prep type: chlorhexidine Biopsy accession number: [id number] Biopsy lab: [facility] Date of biopsy: 05/22/2024 Pre-Op diagnosis: basal cell carcinoma BCC subtype: nodular and superficial MohsAIQ Surgical site (if tumor spans multiple areas, please select predominant area): temple Surgery side: right Surgical site (from skin exam): Right Temple Pre-operative length (cm): 5.5 Pre-operative width (cm): 4.5 Indications for Mohs surgery: anatomic location where tissue conservation is critical and ill-defined borders  Micrographic Surgery Details: Post-operative length (cm):  10.2 Post-operative width (cm): 8.5 Number of Mohs stages: 5 Post surgery depth of defect: dermis and subcutaneous fat  Stage 1    Tumor features identified on Mohs section: basal carcinoma    Depth of tumor invasion after stage: dermis and subcutaneous fat  Stage 2    Tumor features identified on Mohs section: basal carcinoma    Depth of tumor invasion after stage: dermis and subcutaneous fat  Stage 3    Tumor features identified on Mohs section: basal carcinoma    Depth of tumor invasion after stage: dermis and subcutaneous fat  Stage 4    Tumor features identified on Mohs section: basal carcinoma    Depth of tumor invasion after stage: dermis and subcutaneous fat  Stage 5    Tumor features identified on Mohs section: basal carcinoma    Depth of tumor invasion after stage: dermis and subcutaneous fat  Patient tolerance of procedure: tolerated well, no immediate complications  Reconstruction: Was the defect reconstructed?: No (The lesion has not been cleared. The patient will return tomorrow, 08/14/2024 to continue surgery.)    Opioids: Did the patient receive a prescription for opioid/narcotic related to Mohs surgery? Yes   Indications for opioid/narcotics: patient required additional pain relief despite trial of non-opioid analgesia  Antibiotics: Does patient meet AHA guidelines for endocarditis?: No   Does patient meet AHA guidelines for orthopedic prophylaxis?: No   Were antibiotics given on the day of surgery?: No   Did surgery breach mucosa, expose cartilage/bone, involve an area of lymphedema/inflamed/infected tissue? No      Return in 1 day (on 08/14/2024) for continuation of Mohs surgery.  I, Doyce Pan, CMA, am acting as scribe for RUFUS CHRISTELLA HOLY, MD.    08/13/2024  HISTORY OF PRESENT ILLNESS  Paul Mann  is seen for continuation of Mohs surgery of a persistent positive margin BCC biopsy-proven on the right temple.   Medications and allergies: see  patient chart.  Review of systems: Reviewed 8 systems and notable for the above skin cancer.  All other systems reviewed are unremarkable/negative, unless noted in the HPI. Past medical history, surgical history, family history, social history were also reviewed and are noted in the chart/questionnaire.    PHYSICAL EXAMINATION  General: Well-appearing, in no acute distress, alert and oriented x 4. Vitals reviewed in chart (if available).   Skin: Exam reveals a 5.5 x 4.5 cm erythematous papule and biopsy scar on the right temple. There are rhytids, telangiectasias, and lentigines, consistent with photodamage.  Biopsy report(s) reviewed, confirming the diagnosis.   ASSESSMENT  1) Basal Cell Carcinoma of the right temple 2) photodamage 3) solar lentigines   PLAN   1. Due to location, size, histology, or recurrence and the likelihood of subclinical extension as well as the need to conserve normal surrounding tissue, the patient was deemed acceptable for Mohs micrographic surgery (MMS).  The nature and purpose of the procedure, associated benefits and risks including recurrence and scarring, possible complications such as pain, infection, and bleeding, and alternative methods of treatment if appropriate were discussed with the patient during consent. The lesion location was verified by the patient, by reviewing previous notes, pathology reports, and by photographs as well as angulation measurements if available.  Informed consent was reviewed and signed by the patient, and timeout was performed at 8:15 AM. See op note below.  2. For the photodamage and solar lentigines, sun protection discussed/information given on OTC sunscreens, and we recommend continued regular follow-up with primary dermatologist every 6 months or sooner for any growing, bleeding, or changing lesions. 3. Prognosis and future surveillance discussed. 4. Letter with treatment outcome sent to referring provider. 5. Pain  acetaminophen/ibuprofen/oxycodone  5 mg   MOHS MICROGRAPHIC SURGERY AND RECONSTRUCTION  Initial size:   5.5 x 4.5 cm Surgical defect/wound size: 10.2 x 8.5 cm Anesthesia:    0.33% lidocaine  with 1:200,000 epinephrine EBL:    <5 mL Complications:  None Stages: 5  STAGE I: Anesthesia achieved with 0.5% lidocaine  with 1:200,000 epinephrine. ChloraPrep applied. 2 section(s) excised using Mohs technique (this includes total peripheral and deep tissue margin excision and evaluation with frozen sections, excised and interpreted by the same physician). The tumor was first debulked and then excised with an approx. 2 mm margin.  Hemostasis was achieved with electrocautery as needed.  The specimen was then oriented, subdivided/relaxed, inked, and processed using Mohs technique.    Frozen section analysis revealed a positive margin for  multiple, small buds of basaloid cells descending from the epidermis with no dermal invasion and islands of cells with peripheral palisading and a haphazard arrangement of the more central cells in the peripheral and deep margin.    STAGE II: An additional 2 mm margin was excised.  Hemostasis was achieved with electrocautery as needed.  The specimen was then oriented, subdivided/relaxed, inked, and processed using Mohs technique.   Frozen section analysis revealed a positive margin for  multiple, small buds of basaloid cells descending from the epidermis with no dermal invasion and islands of cells with peripheral palisading and a haphazard arrangement of the more central cells in the peripheral and deep margin.  STAGE III: An additional 2 mm margin was excised.  Hemostasis was achieved with electrocautery as needed.  The specimen was then oriented, subdivided/relaxed, inked, and processed using  Mohs technique.   Frozen section analysis revealed a positive margin for  multiple, small buds of basaloid cells descending from the epidermis with no dermal invasion and islands of  cells with peripheral palisading and a haphazard arrangement of the more central cells in the peripheral and deep margin.  STAGE IV: An additional 2 mm margin was excised.  Hemostasis was achieved with electrocautery as needed.  The specimen was then oriented, subdivided/relaxed, inked, and processed using Mohs technique.   Frozen section analysis revealed a positive margin for  multiple, small buds of basaloid cells descending from the epidermis with no dermal invasion and islands of cells with peripheral palisading and a haphazard arrangement of the more central cells in the peripheral and deep margin.  STAGE III: An additional 2 mm margin was excised.  Hemostasis was achieved with electrocautery as needed.  The specimen was then oriented, subdivided/relaxed, inked, and processed using Mohs technique.   Frozen section analysis revealed a positive margin for  multiple, small buds of basaloid cells descending from the epidermis with no dermal invasion and islands of cells with peripheral palisading and a haphazard arrangement of the more central cells in the peripheral and deep margin   Reconstruction  Due to continued positive margins on Mohs sections, the case will continued on the next business day. Positive margins will need to be cleared prior to proceeding with reconstruction.   Documentation: I have reviewed the above documentation for accuracy and completeness, and I agree with the above.  RUFUS CHRISTELLA HOLY, MD  "

## 2024-08-14 ENCOUNTER — Ambulatory Visit: Admitting: Dermatology

## 2024-08-14 ENCOUNTER — Encounter: Payer: Self-pay | Admitting: Dermatology

## 2024-08-14 VITALS — BP 159/63 | HR 67

## 2024-08-14 DIAGNOSIS — C44319 Basal cell carcinoma of skin of other parts of face: Secondary | ICD-10-CM

## 2024-08-14 DIAGNOSIS — L578 Other skin changes due to chronic exposure to nonionizing radiation: Secondary | ICD-10-CM

## 2024-08-14 DIAGNOSIS — L814 Other melanin hyperpigmentation: Secondary | ICD-10-CM

## 2024-08-14 DIAGNOSIS — C4491 Basal cell carcinoma of skin, unspecified: Secondary | ICD-10-CM

## 2024-08-14 NOTE — Progress Notes (Signed)
 "  Follow-Up Visit   Subjective  Paul Mann is a 79 y.o. male who presents for the following: Continuation of Mohs Surgery for continued positive margins started on 08/12/2024 for a BCC on the right temple. Patient is accompanied by his wife.   Patient states that he did not take the Oxycodone  last night. He took 2 tylenol and a Benadryl. He slept until 3am, but then did not get good sleep after that. He did not have much of an appetite for dinner last night, but had an egg sandwich for breakfast this morning.   The following portions of the chart were reviewed this encounter and updated as appropriate: medications, allergies, medical history  Review of Systems:  No other skin or systemic complaints except as noted in HPI or Assessment and Plan.  Objective  Well appearing patient in no apparent distress; mood and affect are within normal limits.  A focused examination was performed of the following areas: Right temple Relevant physical exam findings are noted in the Assessment and Plan.   Right Temple Large open surgical wound   Assessment & Plan   BASAL CELL CARCINOMA (BCC), UNSPECIFIED SITE Right Temple - Mohs surgery  Consent obtained: written  Anticoagulation: Is the patient taking prescription anticoagulant and/or aspirin prescribed/recommended by a physician? No   Was the anticoagulation regimen changed prior to Mohs? No    Anesthesia: Anesthesia method: local infiltration  Procedure Details: Timeout: pre-procedure verification complete Procedure Prep: patient was prepped and draped in usual sterile fashion Prep type: chlorhexidine Biopsy accession number: [id number] Biopsy lab: [facility] Date of biopsy: 05/22/2024 Frozen section biopsy performed: No   Specimen debulked: No   MohsAIQ Surgical site (if tumor spans multiple areas, please select predominant area): temple Surgery side: right Surgical site (from skin exam): Right Temple Pre-operative  length (cm): 10.5 Pre-operative width (cm): 6 Indications for Mohs surgery: anatomic location where tissue conservation is critical, tumor size greater than 2 cm and ill-defined borders Previously treated? No    Micrographic Surgery Details: Post-operative length (cm): 11 Post-operative width (cm): 5.5 Number of Mohs stages: 1 Post surgery depth of defect: dermis, subcutaneous fat and skeletal muscle  Stage 1    Tumor features identified on Mohs section: no tumor identified    Perineural invasion: no perineural invasion  Patient tolerance of procedure: tolerated well, no immediate complications  Reconstruction: Was the defect reconstructed? Yes   Setting of reconstruction: outpatient office When was reconstruction performed? same day Type of reconstruction: flap and partial closure with second intent (Second Intention 6cm x 3.2cm) Type of flap: advancement and transposition   Advancement flap type comment: 12cm x 9cm Transposition flap type: rhombic Transposition flap type comment: 7cm x 5cm  Opioids: Did the patient receive a prescription for opioid/narcotic related to Mohs surgery? Yes (Patient was prescribed pain medication after yesterdays surgery and has not taken any. He still has the prescription.)   Indications for opioid/narcotics: patient required additional pain relief despite trial of non-opioid analgesia  Antibiotics: Does patient meet AHA guidelines for endocarditis?: No   Does patient meet AHA guidelines for orthopedic prophylaxis?: No   Were antibiotics given on the day of surgery?: No   Did surgery breach mucosa, expose cartilage/bone, involve an area of lymphedema/inflamed/infected tissue? No    - Skin repair Complexity:  Complex Informed consent: discussed and consent obtained   Timeout: patient name, date of birth, surgical site, and procedure verified   Procedure prep:  Patient was prepped and draped  in usual sterile fashion Prep type:   Chlorhexidine Anesthesia: the lesion was anesthetized in a standard fashion   Local anesthetic: 0.5% Lidocaine  with Epi 60ccm; 0.25% Lidocaine  with Epi 20cc; 1% Lidocaine  with Epi 5cc. Reason for type of repair: reduce the risk of dehiscence, infection, and necrosis, allow closure of the large defect and preserve normal anatomical and functional relationships   Undermining: edges undermined   Subcutaneous layers (deep stitches):  Subcutaneous suture size: 3-0 and 4-0 Monocryl, 3-0 and 4-0 Vicryl. Stitches:  Buried horizontal mattress Fine/surface layer approximation (top stitches):  Fine approximation suture size: 3-0 and 4-0 Prolene. Stitches: horizontal mattress, simple interrupted and simple running   Hemostasis achieved with: electrodesiccation Outcome: patient tolerated procedure well with no complications   Post-procedure details: sterile dressing applied and wound care instructions given   Dressing type: pressure dressing and petrolatum (Bolster sutured over area healing by second intention.)   Additional details:  Delayed Organogenesis application when patient returns for suture removal    Return in about 1 week (around 08/21/2024) for Suture removal and organogenisis.  Paul Rollene Gobble, RN, am acting as scribe for Paul CHRISTELLA HOLY, MD .   08/14/2024  HISTORY OF PRESENT ILLNESS  Paul Mann is seen in for continuing of positive margin Mohs surgery. Patient is accompanied by his wife.  Reports no other new or changing lesions and has no other complaints today.  Medications and allergies: see patient chart.  Review of systems: Reviewed 8 systems and notable for the above skin cancer.  All other systems reviewed are unremarkable/negative, unless noted in the HPI. Past medical history, surgical history, family history, social history were also reviewed and are noted in the chart/questionnaire.    PHYSICAL EXAMINATION  General: Well-appearing, in no acute distress, alert and  oriented x 4. Vitals reviewed in chart (if available).   Skin: Exam reveals a 10.5 x 6.0 cm erythematous papule and biopsy scar on the right temple. There are rhytids, telangiectasias, and lentigines, consistent with photodamage.  Biopsy report(s) reviewed, confirming the diagnosis.   ASSESSMENT  1) Nodular and Superficial Basal Cell Carcinoma of the right temple 2) photodamage 3) solar lentigines   PLAN   1. Due to location, size, histology, or recurrence and the likelihood of subclinical extension as well as the need to conserve normal surrounding tissue, the patient was deemed acceptable for Mohs micrographic surgery (MMS).  The nature and purpose of the procedure, associated benefits and risks including recurrence and scarring, possible complications such as pain, infection, and bleeding, and alternative methods of treatment if appropriate were discussed with the patient during consent. The lesion location was verified by the patient, by reviewing previous notes, pathology reports, and by photographs as well as angulation measurements if available.  Informed consent was reviewed and signed by the patient, and timeout was performed at 8:15 AM. See op note below.  2. For the photodamage and solar lentigines, sun protection discussed/information given on OTC sunscreens, and we recommend continued regular follow-up with primary dermatologist every 6 months or sooner for any growing, bleeding, or changing lesions. 3. Prognosis and future surveillance discussed. 4. Letter with treatment outcome sent to referring provider. 5. Pain acetaminophen/ibuprofen/oxycodone  5 mg  MOHS MICROGRAPHIC SURGERY AND RECONSTRUCTION  Initial size:   10.5 x 6.5 cm Surgical defect/wound size: 11.0 x 5.2 cm Anesthesia:    0.33% lidocaine  with 1:200,000 epinephrine EBL:    <5 mL Complications:  None Repair type:   2 Adjacent Tissue Transfer (Rhombic and Advancement  Flaps), and Partial Second  SQ suture:   5-0  Monocryl Cutaneous suture:  6-0 Plain gut Final size of the repair: ATT- Advancement: 12.0 x 9.0 = 108 cm^2; ATT-Rhombic: 7.0 x 5.0 = 35.0 cm^2; 2nd intention: 6.0 x 3.2 cm  Stages: 1  STAGE I: Anesthesia achieved with 0.5% lidocaine  with 1:200,000 epinephrine. ChloraPrep applied. 1 section(s) excised using Mohs technique (this includes total peripheral and deep tissue margin excision and evaluation with frozen sections, excised and interpreted by the same physician). The tumor was first debulked and then excised with an approx. 2mm margin.  Hemostasis was achieved with electrocautery as needed.  The specimen was then oriented, subdivided/relaxed, inked, and processed using Mohs technique.    Frozen section analysis revealed a clear deep and peripheral margin.  Reconstruction  PROCEDURE: Advancement Flap The nature of the procedure was discussed with the patient in detail, including alternatives.  The risks discussed included but not limited to potential for infection, bleeding, scar formation, and damage to underlying structures.  The patient understood the risks and signed the consent form (scanned into chart).  This wound was reconstructed with an advancement flap.  Local anesthesia was achieved with the anesthetic indicated above.  The operative site was prepped with a surgical antiseptic solution, and then draped with sterile towels to insure a sterile field.  The beveled edges of the wound were excised to 90 degrees relative to the surface skin plane.  The wound was undermined in all directions, and meticulous hemostasis was achieved with an electrosurgical device.  Relaxing incisions were made, if necessary, for lateral tension release.  The tissue was advanced and closed centrally, and redundant tissue was trimmed as necessary.  The wound was sutured in a layered fashion to close potential dead space and to precisely and securely approximate the wound edges.  The dimensions of the flap were:   12.0 cm x 9.0 cm for a total flap surface area of 108 centimeters squared (cm2).    The wound was covered with petrolatum and a dressing.  The patient understands the need to return immediately for any signs of infection to include swelling, pain, purulent discharge, localized warmth, or fever.  Contact information was provided to the patient (including after-hours pager numbers)   PROCEDURE: Rhombic Transposition Flap The nature of the procedure was discussed with the patient in detail, including alternatives.  The risks discussed included but not limited to potential for infection, bleeding, scar formation, and damage to underlying structures.  The patient understood the risks and signed the consent form (scanned into chart).  This wound was reconstructed with a rhombic transposition flap.  Local anesthesia was achieved with the anesthetic indicated above.  The operative site was prepped with a surgical antiseptic solution, and then draped with sterile towels to insure a sterile field.  The beveled wound edges were then excised to 90 degrees relative to the surrounding skin plane.  The flap was cut and elevated, and the surrounding skin was undermined in all directions.  Meticulous hemostasis was obtained with the electrosurgical device. The secondary defect was first closed.  The flap was then transposed into the defect and cut to precisely fit the wound.  A standing cone was excised to remove redundant tissue.  The wound was sutured in a layered fashion to close potential dead space and to precisely and securely approximate the wound edges.  The dimensions of the flap were:  7.0  cm x 5.0 cm for a total flap surface area of  35.0 centimeters squared (cm2).    Partial Second Intention with plan for Allograft A sterile non-stick pressure dressing was applied, the wound care instruction handout was reviewed with the patient, and appropriate follow-up care was scheduled.  The patient understands the need to  return immediately for any signs of infection to include swelling, pain, purulent discharge, localized warmth, or fever.  Contact information was provided to the patient (including after-hours pager numbers).  Patient was notified of results and repair options were discussed, including second intention healing. After reviewing the advantages and disadvantages of each, we agreed on second intention healing as appropriate.   Bolster dressing stitched in with Xeroform.  The surgical site was then lightly scrubbed with sterile, saline-soaked gauze.  The area was bandaged using Vaseline ointment, non-adherent gauze, gauze pads, and tape to provide an adequate pressure dressing.   The patient tolerated the procedure well, was given detailed written and verbal wound care instructions, and was discharged in good condition.  The patient will follow-up in 7 days and as scheduled with primary dermatologist.    Documentation: I have reviewed the above documentation for accuracy and completeness, and I agree with the above.  Paul CHRISTELLA HOLY, MD "

## 2024-08-14 NOTE — Patient Instructions (Signed)
 Do Not Remove Your Bandage A white pressure bandage has been placed over your wound. Keep the bandage clean, dry, and in place until your next follow-up visit or scheduled reconstruction. Do not remove the bandage unless instructed by your provider. You may reinforce the bandage with medical tape if it becomes loose. IN THE EVENT OF AN AFTER HOURS EMERGENCY (PAIN, BLEEDING, INFECTION), PLEASE CALL OUR OFFICE AND YOU BE DIRECTED TO THE ON CALL STAFF. MYCHART MESSAGES SENT AFTER HOURS WILL BE ADDRESSED ON THE NEXT BUSINESS DAY   What to Watch For After Surgery Bleeding Some light bleeding is normal in the first 24 hours. To lower bleeding risk: Do not lift more than 10 pounds for 1 week If surgery was on your face, head, or neck, do not bend or stoop for 72 hours If surgery was on an arm or leg, keep it raised as much as possible (Limit standing and walking if it was on the leg) If bleeding happens: Press firmly on the area for 30 minutes without looking If bleeding continues, press again for another 30 minutes Call the office if bleeding does not stop  Swelling and Bruising Swelling and bruising are normal. Use an ice pack for 15 minutes each hour during the first 1-2 days Wrap ice in a towel to keep bandages dry Keep the area raised when possible Use extra pillows if surgery was on the head or neck Raise arms or legs near heart level if surgery was there  Infection (Uncommon) Call the office if you notice: Increasing pain Redness or swelling Yellow drainage (pus) Symptoms starting several days after surgery  Pain Control Some pain is normal. Rest, ice, and elevation help You may take Tylenol (acetaminophen) and Ibuprofen (Advil/Motrin) Do not take aspirin for pain If you already take daily aspirin, continue it, but do not add extra Safe option (if allowed): Take 2 ibuprofen (200 mg each) 3 hours later take 2 Tylenol (325 mg each) Keep alternating every 3 hours as  needed If you cannot take ibuprofen: Take Tylenol 650 mg every 4-6 hours EXAMPLE: Time Medicine Dose  6:00 AM Ibuprofen 400 mg (2 tablets of 200 mg)  9:00 AM Tylenol 650 mg (2 tablets of 325 mg)  12:00 PM Ibuprofen 400 mg  3:00 PM Tylenol 650 mg  6:00 PM Ibuprofen 400 mg  9:00 PM Tylenol 650 mg    Healing and Scar Questions When will my scar fade? It takes about 1 year for the scar to mature. Redness usually fades over time. Can my wound open? Yes, the area is weak for the first 6 months. Avoid pulling or stretching it. When will feeling return? Numbness or tingling can last 1-2 years. Some numbness may be permanent. Nose stuffiness If surgery was on your nose, stuffiness can last several months and will slowly improve. When can I stop wound care? You may stop once the skin is fully healed with no open areas. Makeup and sunscreen You may use makeup and sunscreen once: Stitches are removed, and The wound is fully healed Use sunscreen daily on healed skin.  Lumps, Puffiness, and Massage Small lumps under the skin are normal and will go away A small pimple along the scar can happen Use warm compresses Call if it does not improve Puffy scars can sometimes be treated -- call the office if concerned After 1 month, gently massage the scar 2-3 times a day  Scar Products No Scar products should be applied until 1 month after your surgery  Scar creams are optional Plain Vaseline works very well and is safe Stop any product that causes irritation  Future Skin Checks You may develop skin cancer again. See your dermatologist every 6-12 months Regular skin checks help find problems early     Important Information  Due to recent changes in healthcare laws, you may see results of your pathology and/or laboratory studies on MyChart before the doctors have had a chance to review them. We understand that in some cases there may be results that are confusing or concerning to you.  Please understand that not all results are received at the same time and often the doctors may need to interpret multiple results in order to provide you with the best plan of care or course of treatment. Therefore, we ask that you please give us  2 business days to thoroughly review all your results before contacting the office for clarification. Should we see a critical lab result, you will be contacted sooner.   If You Need Anything After Your Visit  If you have any questions or concerns for your doctor, please call our main line at [phone] If no one answers, please leave a voicemail as directed and we will return your call as soon as possible. Messages left after 4 pm will be answered the following business day.   You may also send us  a message via MyChart. We typically respond to MyChart messages within 1-2 business days.  For prescription refills, please ask your pharmacy to contact our office. Our fax number is [phone].  If you have an urgent issue when the clinic is closed that cannot wait until the next business day, you can page your doctor at the number below.    Please note that while we do our best to be available for urgent issues outside of office hours, we are not available 24/7.   If you have an urgent issue and are unable to reach us , you may choose to seek medical care at your doctor's office, retail clinic, urgent care center, or emergency room.  If you have a medical emergency, please immediately call 911 or go to the emergency department. In the event of inclement weather, please call our main line at [phone] for an update on the status of any delays or closures.  Dermatology Medication Tips: Please keep the boxes that topical medications come in in order to help keep track of the instructions about where and how to use these. Pharmacies typically print the medication instructions only on the boxes and not directly on the medication tubes.   If your  medication is too expensive, please contact our office at [phone] or send us  a message through MyChart.   We are unable to tell what your co-pay for medications will be in advance as this is different depending on your insurance coverage. However, we may be able to find a substitute medication at lower cost or fill out paperwork to get insurance to cover a needed medication.   If a prior authorization is required to get your medication covered by your insurance company, please allow us  1-2 business days to complete this process.  Drug prices often vary depending on where the prescription is filled and some pharmacies may offer cheaper prices.  The website www.goodrx.com contains coupons for medications through different pharmacies. The prices here do not account for what the cost may be with help from insurance (it may be cheaper with your insurance), but the website can give you the price if  you did not use any insurance.  - You can print the associated coupon and take it with your prescription to the pharmacy.  - You may also stop by our office during regular business hours and pick up a GoodRx coupon card.  - If you need your prescription sent electronically to a different pharmacy, notify our office through [facility] or by phone at [phone]

## 2024-08-15 ENCOUNTER — Encounter: Payer: Self-pay | Admitting: Dermatology

## 2024-08-15 ENCOUNTER — Telehealth: Payer: Self-pay

## 2024-08-15 ENCOUNTER — Ambulatory Visit: Admitting: Dermatology

## 2024-08-15 DIAGNOSIS — L578 Other skin changes due to chronic exposure to nonionizing radiation: Secondary | ICD-10-CM

## 2024-08-15 DIAGNOSIS — R58 Hemorrhage, not elsewhere classified: Secondary | ICD-10-CM

## 2024-08-15 DIAGNOSIS — S0180XD Unspecified open wound of other part of head, subsequent encounter: Secondary | ICD-10-CM

## 2024-08-15 DIAGNOSIS — C4491 Basal cell carcinoma of skin, unspecified: Secondary | ICD-10-CM

## 2024-08-15 MED ORDER — TRAMADOL HCL 50 MG PO TABS: 50.0000 mg | ORAL_TABLET | Freq: Four times a day (QID) | ORAL | 0 refills | Status: DC | PRN

## 2024-08-15 NOTE — Telephone Encounter (Signed)
 Patient is bleeding and it will not stop. He took the oxycodone  this morning and got sick. Patient is on the way to the office.

## 2024-08-15 NOTE — Progress Notes (Signed)
 "  Follow Up Visit   Subjective  Paul Mann is a 80 y.o. male who presents for the following: follow up from Mohs surgery   The patient presents for follow up from Mohs surgery for a [facility] on the right temple, treated on 08/13/2024, repaired with a rotation and advancement flap. The patient has not removed the bandage since his appointment yesterday.  The patient took one tablet of the oxycodone  prescription but got sick to his stomach. He was not able to sleep much last night. He started bleeding that was draining in his eye. He called this morning and was advised to come to the office.   The following portions of the chart were reviewed this encounter and updated as appropriate: medications, allergies, medical history  Review of Systems:  No other skin or systemic complaints except as noted in HPI or Assessment and Plan.  Objective  Well appearing patient in no apparent distress; mood and affect are within normal limits.  A focal examination was performed including the right face. All findings within normal limits unless otherwise noted below.  Healing wound with mild erythema  Relevant physical exam findings are noted in the Assessment and Plan.    Assessment & Plan   ACTINIC DAMAGE - chronic, secondary to cumulative UV radiation exposure/sun exposure over time - diffuse scaly erythematous macules with underlying dyspigmentation - Recommend daily broad spectrum sunscreen SPF 30+ to sun-exposed areas, reapply every 2 hours as needed.  - Recommend staying in the shade or wearing long sleeves, sun glasses (UVA+UVB protection) and wide brim hats (4-inch brim around the entire circumference of the hat). - Call for new or changing lesions.  Healing Wound s/p Mohs for a BCC on the right temple, treated on 08/13/2024, repaired with a rotation and advancement flap.  - Reassured that wound is healing well - No evidence of infection - No swelling, induration, purulence, dehiscence, or  tenderness out of proportion to the clinical exam, see photo above - Bandage to stay in place until next appointment  Wound Bed Preparation The patient is being prepared for an allograft . The wound bed is thoroughly assessed and prepared to optimize graft take and healing. The necrotic tissue is debrided with a sharp curette, with the help of forceps and a curved iris to create a clean, viable base, and any infected tissue is removed to reduce the risk of graft failure. The depth of the wound preparation extended to the dermis . Hemostasis is achieved to prevent bleeding complications during the grafting process. The wound edges are carefully undermined, and the underlying tissues are inspected for adequate vascularity to support the graft. The wound is then thoroughly irrigated, and the area is prepped with antiseptic solutions to minimize the risk of infection. Once the wound bed is adequately prepared, it is covered with a moist dressing in preparation for the skin graft placement. Wound size: 6.5 x 3.5 cm  Allograft Application  Location of Defect: right temple Defect Measurements (L x W x D): 6.5 x 3.5 cm Reason Defect Not Closed Primarily: Very tight, very large defect, partially closed, risk of disfigurement without allograft help Reason for Choosing Skin Substitute vs. Secondary Intention Healing: Time for healing and discomfort of wound and bandaging  Treatment Plan: Skin Substitute Used: NuShield 6.5cmx 6.5cm Frequency of Application: weekly Anticipated Number of Applications: 8-10  Rationale for Chosen Skin Substitute: Smoking Cessation Discussed (if applicable):N/A  Risks and Complications: All procedural risks, benefits, and alternatives were discussed with  the patient.  Patient verbalized understanding and consented to treatment.  Amount of Skin Substitute Used: 6.5 x 6.0 cm  Product Information:  Lot #: Expiration Date: [id number] Expiration: 05/20/2029 Fixation Method:  Sutures with Bolster  []  Sutures []  Steri-Strips [x]  Bolstered []  Other:  Manufacturers Intended Use Statement (for NuShield)  NuShield is a human placental allograft tissue intended for use as a protective barrier in the management of acute and chronic wounds as well as surgical procedures. NuShield may be applied to protect a variety of partial-and full-thickness acute and chronic wounds, such as dermal ulcers, and wounds with exposed tendon, muscle, joint capsule, and bone. NuShield can be applied from the onset and for the duration of the wound, with subsequent application at the discretion of the health care practitioner. NuShield will naturally be broken down and resorbed into the wound and is not intended to be removed. BASAL CELL CARCINOMA (BCC), UNSPECIFIED SITE   This Visit - traMADol  (ULTRAM ) 50 MG tablet - Take 1 tablet (50 mg total) by mouth every 6 (six) hours as needed for up to 10 doses.  No follow-ups on file.  A total of 30 minutes of physician time was spent on the date of service performing medically necessary evaluation and management services separate from the application of the allograft. This time included a detailed interval history, focused physical examination, review of prior records and pathology as applicable, assessment of wound characteristics and healing status, medical decision-making regarding treatment options and alternatives, risk-benefit discussion, and coordination of care. The complexity of medical decision-making and counseling provided was distinct from and not inherent to the procedural work of allograft application. The patients questions were addressed, and informed consent for the overall management plan was obtained.  Documentation: I have reviewed the above documentation for accuracy and completeness, and I agree with the above.  RUFUS CHRISTELLA HOLY, MD "

## 2024-08-19 ENCOUNTER — Ambulatory Visit: Admitting: Cardiology

## 2024-08-21 ENCOUNTER — Ambulatory Visit: Admitting: Dermatology

## 2024-08-21 ENCOUNTER — Encounter: Payer: Self-pay | Admitting: Dermatology

## 2024-08-21 ENCOUNTER — Other Ambulatory Visit: Payer: Self-pay | Admitting: Dermatology

## 2024-08-21 DIAGNOSIS — C4491 Basal cell carcinoma of skin, unspecified: Secondary | ICD-10-CM

## 2024-08-21 DIAGNOSIS — R58 Hemorrhage, not elsewhere classified: Secondary | ICD-10-CM

## 2024-08-21 DIAGNOSIS — L578 Other skin changes due to chronic exposure to nonionizing radiation: Secondary | ICD-10-CM

## 2024-08-21 DIAGNOSIS — S0180XD Unspecified open wound of other part of head, subsequent encounter: Secondary | ICD-10-CM

## 2024-08-21 MED ORDER — DOXYCYCLINE HYCLATE 100 MG PO TABS: 100.0000 mg | ORAL_TABLET | Freq: Two times a day (BID) | ORAL | 0 refills | Status: DC

## 2024-08-21 MED ORDER — TRAMADOL HCL 50 MG PO TABS: 50.0000 mg | ORAL_TABLET | Freq: Four times a day (QID) | ORAL | 0 refills | Status: AC | PRN

## 2024-08-21 NOTE — Patient Instructions (Signed)

## 2024-08-21 NOTE — Progress Notes (Signed)
 "  Follow Up Visit   Subjective  Paul Mann is a 79 y.o. male who presents for the following: follow up from Mohs surgery   The patient presents for follow up from Mohs surgery for a BCC on the right temple/ right frontal scalp, treated on 08/13/2024, repaired with a rotation and advancement flap and allograft.  First Application of NuSheild was 08/15/2024. He is accompanied by his wife.   The patient has not removed the bandage since his appointment last week. Reports that he took the tramadol  and experienced some relief, he would like a refill today. Spouse notes that he did have bleeding directly after his appointment last week that drained into his eye.   The following portions of the chart were reviewed this encounter and updated as appropriate: medications, allergies, medical history  Review of Systems:  No other skin or systemic complaints except as noted in HPI or Assessment and Plan.  Objective  Well appearing patient in no apparent distress; mood and affect are within normal limits.  A focal examination was performed including the face. All findings within normal limits unless otherwise noted below.  Healing wound with mild erythema  Relevant physical exam findings are noted in the Assessment and Plan.    Assessment & Plan   Healing Wound s/p Mohs for a BCC on the right temple/ right frontal scalp, treated on 08/13/2024, repaired with a rotation and advancement flap, with partial wound healing by second intention - Serous drainage noted - sutures along flap removed today.   - Reassured that wound is healing well - No evidence of infection - No swelling, induration, purulence, dehiscence, or tenderness out of proportion to the clinical exam, see photo above - Bandage can be removed after 36 hours. Patient and spouse advised that vaseline should be applied to the suture line twice daily. Advised that a bandage is not required.  - Doxycycline  sent to pharmacy due to  extensive surgery and extended time spend open during surgery.  - Nu Shield not applied today do to copious amounts of drainage causing maceration along wound edges. Bolster applied today. Plan is to assess wound edges next week once the wound has had time to dry out.   Wound Bed Preparation  The patient is being prepared for a delayed allograft. The wound bed is thoroughly assessed and prepared to optimize graft take and healing. The necrotic tissue is debrided with a sharp curette, with the help of forceps and a curved iris to create a clean, viable base, and any infected tissue is removed to reduce the risk of graft failure. The depth of the wound preparation extended to the dermis. Hemostasis is achieved to prevent bleeding complications during the grafting process. The wound edges are carefully undermined, and the underlying tissues are inspected for adequate vascularity to support the graft. The wound is then thoroughly irrigated, and the area is prepped with antiseptic solutions to minimize the risk of infection. Once the wound bed is adequately prepared, it is covered with a moist dressing in preparation for the skin graft placement. Wound Size 6.0 x 3.5 cm  HISTORY OF BASAL CELL CARCINOMA OF THE SKIN - No evidence of recurrence today - Recommend regular full body skin exams - Recommend daily broad spectrum sunscreen SPF 30+ to sun-exposed areas, reapply every 2 hours as needed.  - Call if any new or changing lesions are noted between office visits  ACTINIC DAMAGE - chronic, secondary to cumulative UV radiation exposure/sun exposure over  time - diffuse scaly erythematous macules with underlying dyspigmentation - Recommend daily broad spectrum sunscreen SPF 30+ to sun-exposed areas, reapply every 2 hours as needed.  - Recommend staying in the shade or wearing long sleeves, sun glasses (UVA+UVB protection) and wide brim hats (4-inch brim around the entire circumference of the hat). - Call  for new or changing lesions.  Return in 1 week (on 08/28/2024) for Organogenesis/allograft application and wound check.  LILLETTE Rollene Gobble, RN, am acting as scribe for RUFUS CHRISTELLA HOLY, MD .   Documentation: I have reviewed the above documentation for accuracy and completeness, and I agree with the above.  RUFUS CHRISTELLA HOLY, MD "

## 2024-08-23 ENCOUNTER — Ambulatory Visit: Admitting: Dermatology

## 2024-08-23 ENCOUNTER — Encounter: Payer: Self-pay | Admitting: Dermatology

## 2024-08-23 DIAGNOSIS — L578 Other skin changes due to chronic exposure to nonionizing radiation: Secondary | ICD-10-CM

## 2024-08-23 DIAGNOSIS — R58 Hemorrhage, not elsewhere classified: Secondary | ICD-10-CM

## 2024-08-23 DIAGNOSIS — C44319 Basal cell carcinoma of skin of other parts of face: Secondary | ICD-10-CM

## 2024-08-23 DIAGNOSIS — S0180XD Unspecified open wound of other part of head, subsequent encounter: Secondary | ICD-10-CM

## 2024-08-23 NOTE — Progress Notes (Signed)
 "  Follow Up Visit   Subjective  Paul Mann is a 79 y.o. male who presents for the following: follow up from Mohs surgery   The patient presents for follow up from Mohs surgery for a BCC on the right frontal scalp, treated on 08/13/24, repaired with rotation and advancement flap and graft and part of the wound is healing by second intention. The patient has been bandaging the wound as directed. The endorse the following concerns: excess drainage. He is accompanied by his wife.   The following portions of the chart were reviewed this encounter and updated as appropriate: medications, allergies, medical history  Review of Systems:  No other skin or systemic complaints except as noted in HPI or Assessment and Plan.  Objective  Well appearing patient in no apparent distress; mood and affect are within normal limits.  A focal examination was performed including scalp, head, face and right frontal scalp. All findings within normal limits unless otherwise noted below.  Healing wound with mild erythema     Relevant physical exam findings are noted in the Assessment and Plan.    Assessment & Plan   Healing Wound s/p Mohs for a BCC on the right temple/ right frontal scalp, treated on 08/13/2024, repaired with a rotation and advancement flap, with partial wound healing by second intention - Serous drainage noted - sutures along flap removed today.   - Reassured that wound is healing well - No evidence of infection - No swelling, induration, purulence, dehiscence, or tenderness out of proportion to the clinical exam, see photo above - Bandage can be removed after 36 hours. Patient and spouse advised that vaseline should be applied to the suture line twice daily.  - Patient on doxycycline  - Nu Shield not applied today do to copious amounts of drainage causing maceration along wound edges. Bolster applied today. Plan is to assess wound edges next week once the wound has had time to dry out.    Wound Bed Preparation  The patient is being prepared for a delayed allograft. The wound bed is thoroughly assessed and prepared to optimize graft take and healing. The necrotic tissue is debrided with a sharp curette, with the help of forceps and a curved iris to create a clean, viable base, and any infected tissue is removed to reduce the risk of graft failure. The depth of the wound preparation extended to the dermis. Hemostasis is achieved to prevent bleeding complications during the grafting process. The wound edges are carefully undermined, and the underlying tissues are inspected for adequate vascularity to support the graft. The wound is then thoroughly irrigated, and the area is prepped with antiseptic solutions to minimize the risk of infection. Once the wound bed is adequately prepared, it is covered with a moist dressing in preparation for the skin graft placement. Wound Size 6.0 x 3.4 cm  HISTORY OF BASAL CELL CARCINOMA OF THE SKIN - No evidence of recurrence today - Recommend regular full body skin exams - Recommend daily broad spectrum sunscreen SPF 30+ to sun-exposed areas, reapply every 2 hours as needed.  - Call if any new or changing lesions are noted between office visits  ACTINIC DAMAGE - chronic, secondary to cumulative UV radiation exposure/sun exposure over time - diffuse scaly erythematous macules with underlying dyspigmentation - Recommend daily broad spectrum sunscreen SPF 30+ to sun-exposed areas, reapply every 2 hours as needed.  - Recommend staying in the shade or wearing long sleeves, sun glasses (UVA+UVB protection) and wide brim  hats (4-inch brim around the entire circumference of the hat). - Call for new or changing lesions.  OPEN WOUND OF FACE WITHOUT COMPLICATION, SUBSEQUENT ENCOUNTER   Existing Treatments - traMADol  (ULTRAM ) 50 MG tablet - Take 1 tablet (50 mg total) by mouth every 6 (six) hours as needed for up to 10 doses. BLEEDING   Existing  Treatments - traMADol  (ULTRAM ) 50 MG tablet - Take 1 tablet (50 mg total) by mouth every 6 (six) hours as needed for up to 10 doses. ACTINIC SKIN DAMAGE   BASAL CELL CARCINOMA (BCC) OF RIGHT TEMPLE REGION    Return in 5 days (on 08/28/2024) for wound check and bandage change.  I, Darice Smock, CMA, am acting as scribe for RUFUS CHRISTELLA HOLY, MD.   Documentation: I have reviewed the above documentation for accuracy and completeness, and I agree with the above.  RUFUS CHRISTELLA HOLY, MD  "

## 2024-08-28 ENCOUNTER — Ambulatory Visit: Admitting: Dermatology

## 2024-08-28 ENCOUNTER — Encounter: Payer: Self-pay | Admitting: Dermatology

## 2024-08-28 ENCOUNTER — Encounter: Admitting: Dermatology

## 2024-08-28 DIAGNOSIS — S0180XD Unspecified open wound of other part of head, subsequent encounter: Secondary | ICD-10-CM

## 2024-08-28 DIAGNOSIS — L578 Other skin changes due to chronic exposure to nonionizing radiation: Secondary | ICD-10-CM

## 2024-08-28 DIAGNOSIS — C44319 Basal cell carcinoma of skin of other parts of face: Secondary | ICD-10-CM

## 2024-08-28 MED ORDER — DOXYCYCLINE HYCLATE 100 MG PO TABS: 100.0000 mg | ORAL_TABLET | Freq: Two times a day (BID) | ORAL | 0 refills | Status: AC

## 2024-08-28 NOTE — Progress Notes (Signed)
 "  Follow Up Visit   Subjective  Paul Mann is a 79 y.o. male who presents for the following: follow up from Mohs surgery   The patient presents for follow up from Mohs surgery for a BCC on the right frontal scalp, treated on 08/13/24, repaired with rotation and advancement flap and graft and part of the wound is healing by second intention. The patient has been bandaging the wound as directed. The endorse the following concerns: excess drainage. He is accompanied by his wife.   The following portions of the chart were reviewed this encounter and updated as appropriate: medications, allergies, medical history  Review of Systems:  No other skin or systemic complaints except as noted in HPI or Assessment and Plan.  Objective  Well appearing patient in no apparent distress; mood and affect are within normal limits.  A focal examination was performed including scalp, head, face and right frontal scalp. All findings within normal limits unless otherwise noted below.  Healing wound with mild erythema     Relevant physical exam findings are noted in the Assessment and Plan.    Assessment & Plan   Healing Wound s/p Mohs for a BCC on the right temple/ right frontal scalp, treated on 08/13/2024, repaired with a rotation and advancement flap, with partial wound healing by second intention - Serous drainage noted - sutures along flap removed today.   - Reassured that wound is healing well - No evidence of infection - No swelling, induration, purulence, dehiscence, or tenderness out of proportion to the clinical exam, see photo above - Bandage can be removed after 36 hours. Patient and spouse advised that vaseline should be applied to the suture line twice daily.  - Patient on doxycycline ; extended for 7 days. -  Nu Shield not applied today do to copious amounts of drainage causing maceration along wound edges. Bolster applied today. Plan is to assess wound edges next week once the wound has  had time to dry out.   Wound Bed Preparation  The patient is being prepared for a delayed allograft. The wound bed is thoroughly assessed and prepared to optimize graft take and healing. The necrotic tissue is debrided with an iris and forceps, with the help of forceps and a curved iris to create a clean, viable base, and any infected tissue is removed to reduce the risk of graft failure. The depth of the wound preparation extended to the dermis. Hemostasis is achieved to prevent bleeding complications during the grafting process. The wound edges are carefully undermined, and the underlying tissues are inspected for adequate vascularity to support the graft. The wound is then thoroughly irrigated, and the area is prepped with antiseptic solutions to minimize the risk of infection. Once the wound bed is adequately prepared, it is covered with a moist dressing in preparation for the skin graft placement. Wound Size 8.0 cm x 4.0 cm  HISTORY OF BASAL CELL CARCINOMA OF THE SKIN - No evidence of recurrence today - Recommend regular full body skin exams - Recommend daily broad spectrum sunscreen SPF 30+ to sun-exposed areas, reapply every 2 hours as needed.  - Call if any new or changing lesions are noted between office visits  ACTINIC DAMAGE - chronic, secondary to cumulative UV radiation exposure/sun exposure over time - diffuse scaly erythematous macules with underlying dyspigmentation - Recommend daily broad spectrum sunscreen SPF 30+ to sun-exposed areas, reapply every 2 hours as needed.  - Recommend staying in the shade or wearing long sleeves,  sun glasses (UVA+UVB protection) and wide brim hats (4-inch brim around the entire circumference of the hat). - Call for new or changing lesions.  Return in about 1 week (around 09/04/2024) for Organogenesis.  LILLETTE Rollene Gobble, RN, am acting as scribe for RUFUS CHRISTELLA HOLY, MD .   Documentation: I have reviewed the above documentation for accuracy and  completeness, and I agree with the above.  RUFUS CHRISTELLA HOLY, MD  "

## 2024-08-28 NOTE — Patient Instructions (Signed)

## 2024-08-29 ENCOUNTER — Telehealth: Payer: Self-pay

## 2024-08-29 NOTE — Telephone Encounter (Signed)
 Discussed with Mrs. Marti that due to the size of the post operative wound, Dr. Corey would like to monitor his healing and progress more closely. Advised that we would like to see him in follow up Monday and Thursday weekly for the next several weeks. They are to expect a call from our schedulers to set up the appointments.

## 2024-09-02 ENCOUNTER — Ambulatory Visit: Admitting: Dermatology

## 2024-09-04 ENCOUNTER — Ambulatory Visit: Admitting: Dermatology

## 2024-09-05 ENCOUNTER — Ambulatory Visit: Admitting: Dermatology

## 2024-09-09 ENCOUNTER — Ambulatory Visit: Admitting: Dermatology

## 2024-09-12 ENCOUNTER — Ambulatory Visit: Admitting: Dermatology

## 2024-09-16 ENCOUNTER — Ambulatory Visit: Admitting: Dermatology

## 2024-09-19 ENCOUNTER — Ambulatory Visit: Admitting: Dermatology

## 2024-09-23 ENCOUNTER — Encounter: Admitting: Dermatology

## 2024-10-02 ENCOUNTER — Encounter: Admitting: Dermatology
